# Patient Record
Sex: Male | Born: 1970 | Race: Black or African American | Hispanic: No | Marital: Married | Smoking: Never smoker
Health system: Southern US, Community
[De-identification: ages and names within clinical notes are randomized; demographics above are authoritative.]

## PROBLEM LIST (undated history)

## (undated) DIAGNOSIS — T7840XA Allergy, unspecified, initial encounter: Secondary | ICD-10-CM

## (undated) DIAGNOSIS — I499 Cardiac arrhythmia, unspecified: Secondary | ICD-10-CM

## (undated) DIAGNOSIS — I82409 Acute embolism and thrombosis of unspecified deep veins of unspecified lower extremity: Secondary | ICD-10-CM

## (undated) DIAGNOSIS — J45909 Unspecified asthma, uncomplicated: Secondary | ICD-10-CM

## (undated) HISTORY — PX: ACHILLES TENDON REPAIR: SUR1153

## (undated) HISTORY — DX: Acute embolism and thrombosis of unspecified deep veins of unspecified lower extremity: I82.409

## (undated) HISTORY — DX: Cardiac arrhythmia, unspecified: I49.9

## (undated) HISTORY — DX: Allergy, unspecified, initial encounter: T78.40XA

---

## 2008-05-14 ENCOUNTER — Encounter: Payer: Self-pay | Admitting: Emergency Medicine

## 2008-05-14 ENCOUNTER — Ambulatory Visit (HOSPITAL_COMMUNITY): Admission: RE | Admit: 2008-05-14 | Discharge: 2008-05-14 | Payer: Self-pay | Admitting: Emergency Medicine

## 2008-05-15 ENCOUNTER — Inpatient Hospital Stay (HOSPITAL_COMMUNITY): Admission: EM | Admit: 2008-05-15 | Discharge: 2008-05-17 | Payer: Self-pay | Admitting: Emergency Medicine

## 2008-05-20 ENCOUNTER — Ambulatory Visit: Payer: Self-pay | Admitting: Internal Medicine

## 2008-05-20 ENCOUNTER — Encounter (INDEPENDENT_AMBULATORY_CARE_PROVIDER_SITE_OTHER): Payer: Self-pay | Admitting: Family Medicine

## 2008-05-20 LAB — CONVERTED CEMR LAB
Band Neutrophils: 0 % (ref 0–10)
Eosinophils Absolute: 0.2 10*3/uL (ref 0.0–0.7)
Lymphocytes Relative: 43 % (ref 12–46)
Lymphs Abs: 1.8 10*3/uL (ref 0.7–4.0)
MCHC: 34.4 g/dL (ref 30.0–36.0)
Neutro Abs: 1.8 10*3/uL (ref 1.7–7.7)
Neutrophils Relative %: 43 % (ref 43–77)
RBC: 5.14 M/uL (ref 4.22–5.81)
WBC: 4.3 10*3/uL (ref 4.0–10.5)

## 2008-05-27 ENCOUNTER — Ambulatory Visit: Payer: Self-pay | Admitting: Family Medicine

## 2008-05-27 ENCOUNTER — Ambulatory Visit: Payer: Self-pay | Admitting: Internal Medicine

## 2008-06-10 ENCOUNTER — Ambulatory Visit: Payer: Self-pay | Admitting: Family Medicine

## 2008-06-10 ENCOUNTER — Ambulatory Visit: Payer: Self-pay | Admitting: Internal Medicine

## 2008-07-01 ENCOUNTER — Ambulatory Visit: Payer: Self-pay | Admitting: Family Medicine

## 2008-07-10 ENCOUNTER — Ambulatory Visit: Payer: Self-pay | Admitting: Internal Medicine

## 2008-07-25 ENCOUNTER — Ambulatory Visit (HOSPITAL_COMMUNITY): Admission: RE | Admit: 2008-07-25 | Discharge: 2008-07-25 | Payer: Self-pay | Admitting: Internal Medicine

## 2008-07-25 ENCOUNTER — Encounter: Payer: Self-pay | Admitting: Internal Medicine

## 2008-08-08 ENCOUNTER — Ambulatory Visit: Payer: Self-pay | Admitting: Adult Health

## 2008-08-13 ENCOUNTER — Ambulatory Visit: Payer: Self-pay | Admitting: Internal Medicine

## 2008-08-15 ENCOUNTER — Ambulatory Visit: Payer: Self-pay | Admitting: Internal Medicine

## 2008-09-05 ENCOUNTER — Ambulatory Visit: Payer: Self-pay | Admitting: Adult Health

## 2008-09-19 ENCOUNTER — Ambulatory Visit: Payer: Self-pay | Admitting: Family Medicine

## 2008-10-16 ENCOUNTER — Ambulatory Visit: Payer: Self-pay | Admitting: Family Medicine

## 2008-10-29 ENCOUNTER — Ambulatory Visit: Payer: Self-pay | Admitting: Internal Medicine

## 2008-10-29 ENCOUNTER — Encounter (INDEPENDENT_AMBULATORY_CARE_PROVIDER_SITE_OTHER): Payer: Self-pay | Admitting: Adult Health

## 2008-10-29 LAB — CONVERTED CEMR LAB
ALT: 32 units/L (ref 0–53)
AST: 31 units/L (ref 0–37)
Calcium: 9.5 mg/dL (ref 8.4–10.5)
Chloride: 101 meq/L (ref 96–112)
Creatinine, Ser: 1.24 mg/dL (ref 0.40–1.50)
Total CHOL/HDL Ratio: 4.9
VLDL: 29 mg/dL (ref 0–40)

## 2008-10-30 ENCOUNTER — Ambulatory Visit: Payer: Self-pay | Admitting: Adult Health

## 2010-07-07 LAB — POCT CARDIAC MARKERS: Myoglobin, poc: 90 ng/mL (ref 12–200)

## 2010-07-07 LAB — DIFFERENTIAL
Lymphocytes Relative: 38 % (ref 12–46)
Lymphs Abs: 1.8 10*3/uL (ref 0.7–4.0)
Monocytes Relative: 9 % (ref 3–12)
Neutro Abs: 2.3 10*3/uL (ref 1.7–7.7)
Neutrophils Relative %: 49 % (ref 43–77)

## 2010-07-07 LAB — CBC
HCT: 41.3 % (ref 39.0–52.0)
HCT: 41.5 % (ref 39.0–52.0)
MCHC: 34.3 g/dL (ref 30.0–36.0)
MCV: 90.1 fL (ref 78.0–100.0)
MCV: 90.3 fL (ref 78.0–100.0)
MCV: 92.3 fL (ref 78.0–100.0)
Platelets: 263 10*3/uL (ref 150–400)
Platelets: 264 10*3/uL (ref 150–400)
RBC: 5.02 MIL/uL (ref 4.22–5.81)
RDW: 13.8 % (ref 11.5–15.5)
RDW: 14 % (ref 11.5–15.5)
WBC: 4.7 10*3/uL (ref 4.0–10.5)

## 2010-07-07 LAB — POCT I-STAT, CHEM 8
Creatinine, Ser: 1.2 mg/dL (ref 0.4–1.5)
Glucose, Bld: 103 mg/dL — ABNORMAL HIGH (ref 70–99)
Hemoglobin: 16.7 g/dL (ref 13.0–17.0)
Potassium: 3.8 mEq/L (ref 3.5–5.1)
TCO2: 29 mmol/L (ref 0–100)

## 2010-07-07 LAB — BASIC METABOLIC PANEL
BUN: 12 mg/dL (ref 6–23)
GFR calc non Af Amer: >60 mL/min (ref >60)
Glucose, Bld: 134 mg/dL — ABNORMAL HIGH (ref 70–99)
Potassium: 4.8 mEq/L (ref 3.5–5.1)

## 2010-07-07 LAB — LIPID PANEL
LDL Cholesterol: 140 mg/dL — ABNORMAL HIGH (ref 0–99)
Triglycerides: 56 mg/dL (ref <150)
VLDL: 11 mg/dL (ref 0–40)

## 2010-07-07 LAB — PROTIME-INR
INR: 1 (ref 0.00–1.49)
INR: 1.1 (ref 0.00–1.49)
Prothrombin Time: 13.3 seconds (ref 11.6–15.2)

## 2010-07-07 LAB — HOMOCYSTEINE: Homocysteine: 10.8 umol/L (ref 4.0–15.4)

## 2010-08-04 NOTE — Op Note (Signed)
NAME:  Larry Huff, Larry Huff                 ACCOUNT NO.:  [id number]   MEDICAL RECORD NO.:  [id number]          PATIENT TYPE:  EMS   LOCATION:  MAJO                         FACILITY:  [facility]   PHYSICIAN:  Mark C. Ophelia Charter, M.D.    DATE OF BIRTH:  [birth year]   DATE OF PROCEDURE:  05/16/2008  DATE OF DISCHARGE:                               OPERATIVE REPORT   PREOPERATIVE DIAGNOSIS:  Right Achilles tendon rupture.   POSTOPERATIVE DIAGNOSIS:  Right Achilles tendon rupture.   PROCEDURE:  Repair of right Achilles tendon rupture.   SURGEON:  Mark C. Ophelia Charter, MD   ANESTHESIA:  General.   TOURNIQUET:  None.   ESTIMATED BLOOD LOSS:  Minimal.   After induction of general anesthesia, the patient placed prone on chest  rolls, careful padding and positioning, shoulder pads, arms at 90,  padding over the ulnar nerve, 10/15 drape was applied to the knee, and  the toes, foot, and calves were prepped with DuraPrep.  The usual  stockinette extremity drape was applied.  Sterile skin marker was used  on the skin, and a 3-cm incision was made over the palpable defect and  the Achilles tendon.  Sheath was split.  There was a combination of old  and more recent ruptures with areas of hemorrhage, typical shredded  Achilles tendon with a gap.  A #2 FiberWire was used with Kessler type  sutures coupled but no weave sutures and some horizontal mattress  sutures for repair.  Care was taken with tying down, not to put undue  tension.  Thomas test was normal after the repair.  Wound was irrigated.  Subcutaneous tissue was reapproximated due to the subacute nature of the  Achilles tendon sheath was shredded and scarred down and could not be  close the separate layer.  A 2-0 Vicryl was placed in the subcutaneous  tissue, 3-0 Vicryl subcuticular closure.  Tincture of Benzoin, Steri-  Strips, 4 x 4s, Webril, and a short-leg splint with foot and plantar  flexion to take tension off the repair.  The patient tolerated the  procedure well and was transferred to the recovery room.  His heparin  drip will be restarted in recovery room.      Mark C. Ophelia Charter, M.D.  Electronically Signed     MCY/MEDQ  D:  05/16/2008  T:  05/16/2008  Job:  [id number]

## 2010-08-04 NOTE — H&P (Signed)
NAME:  Larry Huff, Larry Huff NO.:  [id number]   MEDICAL RECORD NO.:  [id number]          PATIENT TYPE:  INP   LOCATION:  4733                         FACILITY:  [facility]   PHYSICIAN:  Vania Rea, M.D. DATE OF BIRTH:  12-22-1970   DATE OF ADMISSION:  05/14/2008  DATE OF DISCHARGE:                              HISTORY & PHYSICAL   PRIMARY CARE PHYSICIAN:  Unassigned.   CHIEF COMPLAINT:  Chest pain since yesterday.   HISTORY OF PRESENT ILLNESS:  This is a 40 year old African American  gentleman with history of having been diagnosed with a ruptured right  Achilles tendon on February 10 while playing basketball as a part of his  job.  Was seen at [facility] and diagnosis was made, was given  ibuprofen anti-inflammatory medications and had a temporary cast  applied, but since then, there apparently have been some administrative  wranglings on his job as to who is responsible for repairing his  Achilles tendon.  The patient has been having pain and swelling of the  foot, has been walking with crutches, but yesterday started having  sudden onset of right-sided chest pain associated with difficulty  breathing.  Eventually, his mother, who is familiar with this type of  situation since her mother died from a pulmonary embolus associated with  blood clots in her leg after leg injury, insisted he come to the  emergency room.  In the emergency room, the patient was evaluated.  A CT  angiogram of the chest was done.  He is found to have a right-sided  pulmonary embolus.  Hospitalist service was called to assist with  management.  He has been having no dizziness nor syncope and no nausea,  vomiting or diarrhea.  No headaches, no palpitations.   PAST MEDICAL HISTORY:  Childhood asthma, allergies to cats and dogs.   MEDICATIONS:  Ibuprofen, Tylenol and took one dose of aspirin when the  chest pain started yesterday.   ALLERGIES:  Erythromycin.   SOCIAL HISTORY:  Denies  tobacco, alcohol or illicit drug use.  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   FAMILY HISTORY:  There is no family history of heart disease, diabetes  or thrombophilia, but his maternal grandmother died from a pulmonary  embolus after having had a fall and injured her leg which was not  attended to.   REVIEW OF SYSTEMS:  On a 10-point review of systems, other than noted  above, unremarkable.   PHYSICAL EXAMINATION:  GENERAL:  A well-built muscular young African  American gentleman, lying on the stretcher in mild distress.  VITALS:  His temperature is 97.7, pulse 88, respiration 18, blood  pressure 137/81.  He is still saturating at 98% on 2 liters.  He  describes pain as 10/10.  His pupils are round and equal.  Mucous  membranes pink and anicteric.  No cervical lymphadenopathy or  thyromegaly.  No jugular venous  distention or carotid bruits.  CHEST:  Clear to auscultation bilaterally.  CARDIOVASCULAR:  Regular rhythm.  ABDOMEN:  Obese, soft and nontender.  EXTREMITIES:  The right lower extremity is in a temporary cast up to his  mid calf.  Toes are warm.  Unable to feel pulses on the right foot  because of the bandage.  On the left, dorsalis is 2+.  CENTRAL NERVOUS SYSTEM:  Cranial nerves II-XII are grossly intact and he  has no focal lateralizing neurologic signs.   LABORATORY DATA:  CBC is unremarkable.  His hemoglobin is 15.8.  His  white count is 4.7.  His serum chemistry is remarkable only for BUN of  21 and creatinine of 1.2 is glucose is 103.  PTT, PT and INR are all  normal.  His D-dimer was 1.9.  Chest x-ray shows no active disease.  A  CT angiogram of the chest shows acute right-sided pulmonary embolus.   ASSESSMENT:  1. Acute pulmonary embolus.  2. Ruptured Achilles tendon.   PLAN:  Will admit this gentleman for  anticoagulation with heparin.  Will  consult orthopedics about possible repair of tendon.  Other plans as per  orders.      Vania Rea, M.D.  Electronically Signed     LC/MEDQ  D:  05/14/2008  T:  05/15/2008  Job:  [id number]   cc:   [facility]

## 2010-08-04 NOTE — Discharge Summary (Signed)
NAME:  Larry Huff, Larry Huff                 ACCOUNT NO.:  [id number]   MEDICAL RECORD NO.:  [id number]          PATIENT TYPE:  INP   LOCATION:  4733                         FACILITY:  [facility]   PHYSICIAN:  Peggye Pitt, M.D. DATE OF BIRTH:  01/11/1971   DATE OF ADMISSION:  05/14/2008  DATE OF DISCHARGE:  05/17/2008                               DISCHARGE SUMMARY   DISCHARGE DIAGNOSES:  1. Acute pulmonary embolism.  2. Ruptured right Achilles tendon status post repair.   DISCHARGE MEDICATIONS:  1. Coumadin 10 mg daily until seen at [facility] on Monday, at which      time, his Coumadin level will be further addressed.  2. Lovenox 115 mg injected subcutaneously twice daily at least until      Monday and then may need to continue further based on results of      INR testing.  3. Percocet 5/325 mg 1-2 tablets every 4 hours as needed for pain.      This prescription has been provided by Dr. Ophelia Charter' assistant.   DISPOSITION AND FOLLOWUP:  Larry Huff is discharged home in stable  condition.  He is to follow up on Monday at [facility] with Fannie Knee  Drinkard for an INR check.  On Monday, he will complete his 5 days of  mandatory overlap for venous thromboembolism core measures.  When his  INR is checked, he will be instructed as to further dosing of Coumadin  and if any further Lovenox will be needed.  He is also instructed to  call his orthopedist, Dr. Annell Greening in 10-14 days for an appointment  and he has been given a card by Dr. Ophelia Charter' PA.   CONSULTATION THIS HOSPITALIZATION:  Dr. Ophelia Charter with Orthopedics.   IMAGES AND PROCEDURES PERFORMED DURING THIS HOSPITALIZATION:  1. A chest x-ray on May 14, 2008, that showed no active lung      disease.  2. CT angiogram of the chest on May 14, 2008, that was positive      for pulmonary emboli within the right pulmonary arterial tree.   HISTORY AND PHYSICAL EXAMINATION:  For full details, please refer to  history and physical dictated by Dr.  Orvan Falconer on May 14, 2008, but  in brief, Larry Huff is a very pleasant, 40 year old African American man  with no significant past medical history who came in with complaints of  chest pain.  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  He was seen at [facility] and a  diagnosis was given, was given ibuprofen and a temporary cast, and then  he was lost to follow up.  He returned today with complaints of chest  pain and shortness of breath and for that reason, his mother brought him  into the emergency room for further evaluation and management.   HOSPITAL COURSE:  1. Pulmonary embolus.  He has been started on Coumadin and Lovenox.      This was likely provoked by his right ruptured  Achilles tendon and      his immobility, hence a hypercoagulable workup has not been      started.  As stated above in disposition section, he will follow up      on Monday for his INR checks and he will be given further      instructions as to his Coumadin and Lovenox doses.  2. Ruptured Achilles tendon.  He has undergone repair by Dr. Annell Greening and he has appropriate follow up with him in 10-14 days.  3. He does not have any other chronic medical issues.   VITAL SIGNS UPON DISCHARGE:  Blood pressure 140/73, heart rate 67,  respirations 20, and O2 saturations 99% on room air with a temperature  of 98.0.   LABORATORIES ON DAY OF DISCHARGE:  WBC 7.9, hemoglobin 14.3, and  platelets of 263.  His INR on day of discharge is 1.2.       Peggye Pitt, M.D.  Electronically Signed     EH/MEDQ  D:  05/17/2008  T:  05/18/2008  Job:  [id number]   cc:   Veverly Fells. Ophelia Charter, M.D.  Willis Modena, MD

## 2012-08-04 ENCOUNTER — Emergency Department (HOSPITAL_COMMUNITY)
Admission: EM | Admit: 2012-08-04 | Discharge: 2012-08-04 | Disposition: A | Discharge status: Home / Self Care | Payer: 59 | Source: Home / Self Care

## 2012-08-04 ENCOUNTER — Emergency Department (INDEPENDENT_AMBULATORY_CARE_PROVIDER_SITE_OTHER): Payer: 59

## 2012-08-04 ENCOUNTER — Encounter (HOSPITAL_COMMUNITY): Payer: Self-pay | Admitting: Emergency Medicine

## 2012-08-04 DIAGNOSIS — K5900 Constipation, unspecified: Secondary | ICD-10-CM

## 2012-08-04 DIAGNOSIS — R141 Gas pain: Secondary | ICD-10-CM

## 2012-08-04 DIAGNOSIS — R143 Flatulence: Secondary | ICD-10-CM

## 2012-08-04 HISTORY — DX: Unspecified asthma, uncomplicated: J45.909

## 2012-08-04 LAB — POCT URINALYSIS DIP (DEVICE)
Hgb urine dipstick: NEGATIVE
Protein, ur: NEGATIVE mg/dL
Specific Gravity, Urine: >=1.03 (ref 1.005–1.030)
Urobilinogen, UA: 0.2 mg/dL (ref 0.0–1.0)
pH: 6 (ref 5.0–8.0)

## 2012-08-04 NOTE — ED Provider Notes (Signed)
Medical screening examination/treatment/procedure(s) were performed by resident physician or non-physician practitioner and as supervising physician I was immediately available for consultation/collaboration.   Barkley Bruns MD.   Linna Hoff, MD 08/04/12 [phone]

## 2012-08-04 NOTE — ED Notes (Signed)
Pt c/o abdominal pain x 5 days. Had diarrhea which has subsided. Feels right flank pain that radiates around to the front. NO n/v or fever. Pt is alert and oriented.

## 2012-08-04 NOTE — ED Provider Notes (Signed)
History     CSN: [id number]  Arrival date & time 08/04/12  1001   None     Chief Complaint  Patient presents with  . Abdominal Pain    (Consider location/radiation/quality/duration/timing/severity/associated sxs/prior treatment) HPI Comments: 42 year old male presents with abdominal discomfort for approximately 5 days. The discomfort began in the right lateral abdomen and then radiated across the mid abdomen. He has had soft stools with one day of diarrhea in the past 4 days. Most of the discomfort occurs at night and lesser during the day. He has been having BMs daily and feels like they are normal for him. He denies flank pain, chest pain or shortness of breath. Denies urinary symptoms.   Past Medical History  Diagnosis Date  . Asthma     Past Surgical History  Procedure Laterality Date  . Achilles tendon repair      No family history on file.  History  Substance Use Topics  . Smoking status: Never Smoker   . Smokeless tobacco: Not on file  . Alcohol Use: 0.0 oz/week    1-2 Glasses of wine per week     Comment: occasionally      Review of Systems  Constitutional: Negative.   HENT: Negative.   Respiratory: Negative.   Cardiovascular: Negative.   Gastrointestinal: Positive for abdominal pain, diarrhea and abdominal distention. Negative for blood in stool and rectal pain.  Genitourinary: Negative.   Skin: Negative.   Neurological: Negative.     Allergies  Erythromycin  Home Medications  No current outpatient prescriptions on file.  BP 111/74  Pulse 58  Temp(Src) 97.8 F (36.6 C) (Oral)  Resp 16  SpO2 100%  Physical Exam  Nursing note and vitals reviewed. Constitutional: He is oriented to person, place, and time. He appears well-developed and well-nourished. No distress.  Patient states he is relatively comfortable today not having much discomfort. Overall abdominal symptoms improving today.  Neck: Normal range of motion.  Cardiovascular: Normal  rate and normal heart sounds.   Pulmonary/Chest: Effort normal.  Abdominal: Soft. Bowel sounds are normal. He exhibits no distension and no mass. There is no rebound and no guarding.  Mild tenderness in the right abdomen between the costal margin and iliac crest.  Musculoskeletal: He exhibits no edema and no tenderness.  Neurological: He is alert and oriented to person, place, and time. He exhibits normal muscle tone.  Skin: Skin is warm and dry.  Psychiatric: He has a normal mood and affect.    ED Course  Procedures (including critical care time)  Labs Reviewed  POCT URINALYSIS DIP (DEVICE)   Dg Abd 1 View  08/04/2012   *RADIOLOGY REPORT*  Clinical Data: Abdominal pain  ABDOMEN - 1 VIEW  Comparison: None.  Findings: Normal bowel gas pattern.  No dilatation or ileus appreciated.  No osseous abnormality.  Left hemi pelvis calcifications compatible with venous phleboliths.  IMPRESSION: No acute finding by plain radiography   Original Report Authenticated By: Judie Petit. Shick, M.D.     1. Abdominal gas pain   2. Constipation       MDM  Patient does not have an acute abdomen. He is feeling better topically after having a bowel movement. There is a gas and stool in the large colon but otherwise no other abnormalities such as air-fluid levels. He will be given instructions for constipation and to take MiraLax as directed. 3 no symptoms problems or worsening, development of fever or increased pain go to emergency apartment.  Hayden Rasmussen, NP 08/04/12 [phone]

## 2013-03-22 DIAGNOSIS — I82409 Acute embolism and thrombosis of unspecified deep veins of unspecified lower extremity: Secondary | ICD-10-CM

## 2013-03-22 HISTORY — PX: ACHILLES TENDON REPAIR: SUR1153

## 2013-03-22 HISTORY — DX: Acute embolism and thrombosis of unspecified deep veins of unspecified lower extremity: I82.409

## 2013-07-05 ENCOUNTER — Encounter (HOSPITAL_COMMUNITY): Payer: Self-pay | Admitting: Emergency Medicine

## 2013-07-05 ENCOUNTER — Observation Stay (HOSPITAL_COMMUNITY)
Admission: EM | Admit: 2013-07-05 | Discharge: 2013-07-06 | Disposition: A | Discharge status: Home / Self Care | Payer: BC Managed Care – PPO | Attending: Internal Medicine | Admitting: Internal Medicine

## 2013-07-05 DIAGNOSIS — Z7901 Long term (current) use of anticoagulants: Secondary | ICD-10-CM | POA: Insufficient documentation

## 2013-07-05 DIAGNOSIS — G459 Transient cerebral ischemic attack, unspecified: Secondary | ICD-10-CM

## 2013-07-05 DIAGNOSIS — Z566 Other physical and mental strain related to work: Secondary | ICD-10-CM

## 2013-07-05 DIAGNOSIS — R200 Anesthesia of skin: Secondary | ICD-10-CM | POA: Diagnosis present

## 2013-07-05 DIAGNOSIS — F430 Acute stress reaction: Secondary | ICD-10-CM | POA: Insufficient documentation

## 2013-07-05 DIAGNOSIS — R51 Headache: Secondary | ICD-10-CM | POA: Insufficient documentation

## 2013-07-05 DIAGNOSIS — R209 Unspecified disturbances of skin sensation: Principal | ICD-10-CM | POA: Insufficient documentation

## 2013-07-05 DIAGNOSIS — J45909 Unspecified asthma, uncomplicated: Secondary | ICD-10-CM | POA: Insufficient documentation

## 2013-07-05 LAB — I-STAT CHEM 8, ED
BUN: 16 mg/dL (ref 6–23)
CREATININE: 1.3 mg/dL (ref 0.50–1.35)
Calcium, Ion: 1.18 mmol/L (ref 1.12–1.23)
Chloride: 100 mEq/L (ref 96–112)
Glucose, Bld: 102 mg/dL — ABNORMAL HIGH (ref 70–99)
HEMATOCRIT: 50 % (ref 39.0–52.0)
HEMOGLOBIN: 17 g/dL (ref 13.0–17.0)
POTASSIUM: 4.8 meq/L (ref 3.7–5.3)
SODIUM: 139 meq/L (ref 137–147)
TCO2: 29 mmol/L (ref 0–100)

## 2013-07-05 LAB — CBG MONITORING, ED: GLUCOSE-CAPILLARY: 93 mg/dL (ref 70–99)

## 2013-07-05 NOTE — ED Provider Notes (Signed)
CSN: [id number]     Arrival date & time 07/05/13  2101 History  This chart was scribed for non-physician practitioner Garald Balding, NP working with Wynetta Fines, MD by Adriana Reams, ED Scribe. This patient was seen in room WA01/WA01 and the patient's care was started at 2315.   First MD Initiated Contact with Patient 07/05/13 2315     Chief Complaint  Patient presents with  . Numbness    The history is provided by the patient. No language interpreter was used.   HPI Comments: Larry Huff is a 43 y.o. male who presents to the Emergency Department complaining of numbness to his left cheek that began at 8 pm tonight and lasted for approximately 60 minutes. He states during this episode the tips of his third and fourth fingers of his left hand also felt numb. He took 2 Asprin tablets at this time which provided some relief. A family member states that his speech was not slurred during this episode and denies facial droop. He denies any trouble walking or balancing, weakness, trouble thinking, trouble speaking, difficulty swallowing, or any other symptoms. He is not currently experiencing numbness.  His father recently had a stroke.  He states during the episode he had a mild HA which has since resolved. He typically gets headaches with his allergies. He has has increased congestion lately, and has been taking Claritin for his symptoms. He also endorses right sided dental pain.   Past Medical History  Diagnosis Date  . Asthma    Past Surgical History  Procedure Laterality Date  . Achilles tendon repair     Family History  Problem Relation Age of Onset  . Stroke Father    History  Substance Use Topics  . Smoking status: Never Smoker   . Smokeless tobacco: Not on file  . Alcohol Use: 0.0 oz/week    1-2 Glasses of wine per week     Comment: occasionally    Review of Systems  Constitutional: Negative for fever.  HENT: Positive for congestion and dental problem. Negative for  rhinorrhea, sinus pressure and trouble swallowing.   Respiratory: Negative for shortness of breath.   Cardiovascular: Negative for chest pain.  Musculoskeletal: Negative for neck pain.  Neurological: Positive for numbness and headaches. Negative for dizziness, facial asymmetry, speech difficulty and weakness.  Psychiatric/Behavioral: Negative for confusion. The patient is nervous/anxious.   All other systems reviewed and are negative.     Allergies  Erythromycin  Home Medications   Prior to Admission medications   Medication Sig Start Date End Date Taking? Authorizing Provider  aspirin 325 MG tablet Take 650 mg by mouth once.   Yes Historical Provider, MD  loratadine (CLARITIN) 10 MG tablet Take 10 mg by mouth daily.   Yes Historical Provider, MD   BP 136/74  Pulse 81  Temp(Src) 98.3 F (36.8 C) (Oral)  Resp 16  SpO2 99%  Physical Exam  Nursing note and vitals reviewed. Constitutional: He appears well-developed and well-nourished.  HENT:  Head: Normocephalic.  Right Ear: External ear normal.  Left Ear: External ear normal.  Eyes: Pupils are equal, round, and reactive to light.  Neck: Normal range of motion.  Cardiovascular: Normal rate and regular rhythm.   Pulmonary/Chest: Effort normal and breath sounds normal.  Abdominal: Soft. Bowel sounds are normal.  Musculoskeletal: Normal range of motion.  Neurological: He is alert. He has normal strength. A sensory deficit is present. No cranial nerve deficit. He displays a negative Romberg  sign.  Skin: Skin is warm and dry.    ED Course  Procedures (including critical care time) DIAGNOSTIC STUDIES: Oxygen Saturation is 99% on RA, normal by my interpretation.    COORDINATION OF CARE: 11:24 PM Discussed treatment plan which includes EKG, CBG monitoring, I-State chem 8 and labs with pt at bedside and pt agreed to plan.    Labs Review Labs Reviewed  CBC WITH DIFFERENTIAL - Abnormal; Notable for the following:     Eosinophils Relative 6 (*)    All other components within normal limits  COMPREHENSIVE METABOLIC PANEL - Abnormal; Notable for the following:    Glucose, Bld 100 (*)    GFR calc non Af Amer 73 (*)    GFR calc Af Amer 85 (*)    All other components within normal limits  I-STAT CHEM 8, ED - Abnormal; Notable for the following:    Glucose, Bld 102 (*)    All other components within normal limits  PROTIME-INR  CBG MONITORING, ED    Imaging Review Ct Head Wo Contrast  07/06/2013   CLINICAL DATA:  Numbness in the left cheek  EXAM: CT HEAD WITHOUT CONTRAST  TECHNIQUE: Contiguous axial images were obtained from the base of the skull through the vertex without intravenous contrast.  COMPARISON:  None.  FINDINGS: There is no evidence of mass effect, midline shift or extra-axial fluid collections. There is no evidence of a space-occupying lesion or intracranial hemorrhage. There is no evidence of a cortical-based area of acute infarction.  The ventricles and sulci are appropriate for the patient's age. The basal cisterns are patent.  Visualized portions of the orbits are unremarkable. The visualized portions of the paranasal sinuses and mastoid air cells are unremarkable.  The osseous structures are unremarkable.  IMPRESSION: Normal CT brain without intravenous contrast.   Electronically Signed   By: Kathreen Devoid   On: 07/06/2013 00:46     EKG Interpretation None      MDM  Spoke with Dr. Nicole [facility]  Would like patient transferred to [facility] will make arrangements for transfer  Final diagnoses:  TIA (transient ischemic attack)      I personally performed the services described in this documentation, which was scribed in my presence. The recorded information has been reviewed and is accurate.    Garald Balding, NP 07/06/13 [phone]

## 2013-07-05 NOTE — ED Notes (Signed)
Pt reports feeling some pressure to the L cheek area and some tingling in his L fingers that started at 8pm. Pt denies any trouble walking or balance, family denies any slurred speech or facial drooping. Family does report giving pt 2 ASA tablets. Pt alert and oriented in triage room.

## 2013-07-06 ENCOUNTER — Emergency Department (HOSPITAL_COMMUNITY): Payer: BC Managed Care – PPO

## 2013-07-06 ENCOUNTER — Observation Stay (HOSPITAL_COMMUNITY): Payer: BC Managed Care – PPO

## 2013-07-06 ENCOUNTER — Encounter (HOSPITAL_COMMUNITY): Payer: Self-pay | Admitting: Internal Medicine

## 2013-07-06 DIAGNOSIS — Z569 Unspecified problems related to employment: Secondary | ICD-10-CM

## 2013-07-06 DIAGNOSIS — G459 Transient cerebral ischemic attack, unspecified: Secondary | ICD-10-CM

## 2013-07-06 DIAGNOSIS — R200 Anesthesia of skin: Secondary | ICD-10-CM | POA: Diagnosis present

## 2013-07-06 DIAGNOSIS — R209 Unspecified disturbances of skin sensation: Principal | ICD-10-CM

## 2013-07-06 LAB — COMPREHENSIVE METABOLIC PANEL
ALBUMIN: 4.5 g/dL (ref 3.5–5.2)
ALK PHOS: 61 U/L (ref 39–117)
ALT: 32 U/L (ref 0–53)
AST: 28 U/L (ref 0–37)
BUN: 12 mg/dL (ref 6–23)
CHLORIDE: 97 meq/L (ref 96–112)
CO2: 27 mEq/L (ref 19–32)
Calcium: 9.7 mg/dL (ref 8.4–10.5)
Creatinine, Ser: 1.21 mg/dL (ref 0.50–1.35)
GFR calc Af Amer: 85 mL/min — ABNORMAL LOW (ref >90)
GFR calc non Af Amer: 73 mL/min — ABNORMAL LOW (ref >90)
GLUCOSE: 100 mg/dL — AB (ref 70–99)
POTASSIUM: 4.3 meq/L (ref 3.7–5.3)
SODIUM: 138 meq/L (ref 137–147)
TOTAL PROTEIN: 8.1 g/dL (ref 6.0–8.3)
Total Bilirubin: 0.5 mg/dL (ref 0.3–1.2)

## 2013-07-06 LAB — LIPID PANEL
CHOLESTEROL: 214 mg/dL — AB (ref 0–200)
HDL: 49 mg/dL (ref >39)
LDL Cholesterol: 151 mg/dL — ABNORMAL HIGH (ref 0–99)
Total CHOL/HDL Ratio: 4.4 RATIO
Triglycerides: 72 mg/dL (ref <150)
VLDL: 14 mg/dL (ref 0–40)

## 2013-07-06 LAB — CBC WITH DIFFERENTIAL/PLATELET
BASOS PCT: 1 % (ref 0–1)
Basophils Absolute: 0 10*3/uL (ref 0.0–0.1)
EOS ABS: 0.3 10*3/uL (ref 0.0–0.7)
Eosinophils Relative: 6 % — ABNORMAL HIGH (ref 0–5)
HCT: 46.7 % (ref 39.0–52.0)
Hemoglobin: 15.7 g/dL (ref 13.0–17.0)
LYMPHS ABS: 2 10*3/uL (ref 0.7–4.0)
Lymphocytes Relative: 34 % (ref 12–46)
MCH: 30.9 pg (ref 26.0–34.0)
MCHC: 33.6 g/dL (ref 30.0–36.0)
MCV: 91.9 fL (ref 78.0–100.0)
Monocytes Absolute: 0.6 10*3/uL (ref 0.1–1.0)
Monocytes Relative: 10 % (ref 3–12)
NEUTROS ABS: 3 10*3/uL (ref 1.7–7.7)
NEUTROS PCT: 50 % (ref 43–77)
PLATELETS: 271 10*3/uL (ref 150–400)
RBC: 5.08 MIL/uL (ref 4.22–5.81)
RDW: 14 % (ref 11.5–15.5)
WBC: 5.9 10*3/uL (ref 4.0–10.5)

## 2013-07-06 LAB — PROTIME-INR
INR: 0.98 (ref 0.00–1.49)
Prothrombin Time: 12.8 seconds (ref 11.6–15.2)

## 2013-07-06 LAB — HEMOGLOBIN A1C
Hgb A1c MFr Bld: 5.8 % — ABNORMAL HIGH (ref <5.7)
MEAN PLASMA GLUCOSE: 120 mg/dL — AB (ref <117)

## 2013-07-06 MED ORDER — LORATADINE 10 MG PO TABS
10.0000 mg | ORAL_TABLET | Freq: Every day | ORAL | Status: DC
  Filled 2013-07-06 (×2): qty 1

## 2013-07-06 MED ORDER — ASPIRIN EC 81 MG PO TBEC: 81.0000 mg | DELAYED_RELEASE_TABLET | Freq: Every day | ORAL | Status: AC

## 2013-07-06 MED ORDER — HEPARIN SODIUM (PORCINE) 5000 UNIT/ML IJ SOLN
5000.0000 [IU] | Freq: Three times a day (TID) | INTRAMUSCULAR | Status: DC
  Administered 2013-07-06: 5000 [IU] via SUBCUTANEOUS
  Filled 2013-07-06 (×4): qty 1

## 2013-07-06 NOTE — Progress Notes (Signed)
Stroke Team Progress Note  HISTORY Larry Huff is an 43 y.o. male a history of asthma who presented to the emergency room at [facility] with a complaint of transient weakness involving left cheek as well as fingertips of left hand. Symptoms had resolved by the time he arrived in the emergency room. He was last known well at 8 PM last night. There is no previous history of stroke nor TIA. Patient has not been on antiplatelet therapy a routine basis. CT scan of his head showed no acute intracranial abnormality. NIH stroke score at the time of this evaluation was 0.   LSN: 8 PM on 07/05/2013  tPA Given: No: Deficits resolved  MRankin: 0   SUBJECTIVE The patient's mother was at the bedside. The patient admits to being under a great deal of stress recently with work and family. He also notes that he had a headache associated with his symptoms.  OBJECTIVE Most recent Vital Signs: Filed Vitals:   07/06/13 0400 07/06/13 0400 07/06/13 0430 07/06/13 0500  BP: 138/69  149/70   Pulse: 80  76   Temp:  98.3 F (36.8 C) 99 F (37.2 C)   TempSrc:   Oral   Resp: 18  18   Height:    6' 4"  (1.93 m)  Weight:    250 lb (113.399 kg)  SpO2: 94%  99%    CBG (last 3)   Recent Labs  07/05/13 2132  GLUCAP 93    IV Fluid Intake:     MEDICATIONS  . heparin  5,000 Units Subcutaneous 3 times per day  . loratadine  10 mg Oral Daily   PRN:    Diet:  Cardiac thin liquids Activity:  As tolerated DVT Prophylaxis:  Subcutaneous heparin  CLINICALLY SIGNIFICANT STUDIES Basic Metabolic Panel:  Recent Labs Lab 07/05/13 2240 07/05/13 2300  NA 138 139  K 4.3 4.8  CL 97 100  CO2 27  --   GLUCOSE 100* 102*  BUN 12 16  CREATININE 1.21 1.30  CALCIUM 9.7  --    Liver Function Tests:  Recent Labs Lab 07/05/13 2240  AST 28  ALT 32  ALKPHOS 61  BILITOT 0.5  PROT 8.1  ALBUMIN 4.5   CBC:  Recent Labs Lab 07/05/13 2240 07/05/13 2300  WBC 5.9  --   NEUTROABS 3.0  --   HGB 15.7 17.0  HCT 46.7 50.0   MCV 91.9  --   PLT 271  --    Coagulation:  Recent Labs Lab 07/05/13 2240  LABPROT 12.8  INR 0.98   Cardiac Enzymes: No results found for this basename: CKTOTAL, CKMB, CKMBINDEX, TROPONINI,  in the last 168 hours Urinalysis: No results found for this basename: COLORURINE, APPERANCEUR, LABSPEC, PHURINE, GLUCOSEU, HGBUR, BILIRUBINUR, KETONESUR, PROTEINUR, UROBILINOGEN, NITRITE, LEUKOCYTESUR,  in the last 168 hours Lipid Panel    Component Value Date/Time   CHOL 209* 10/29/2008 2034   TRIG 144 10/29/2008 2034   HDL 43 10/29/2008 2034   CHOLHDL 4.9 Ratio 10/29/2008 2034   VLDL 29 10/29/2008 2034   LDLCALC 137* 10/29/2008 2034   HgbA1C  Lab Results  Component Value Date   HGBA1C  Value: 5.8 (NOTE)   The ADA recommends the following therapeutic goal for glycemic   control related to Hgb A1C measurement:   Goal of Therapy:   < 7.0% Hgb A1C   Reference: American Diabetes Association: Clinical Practice   Recommendations 2008, Diabetes Care,  2008, 31:(Suppl 1). 05/15/2008    Urine  Drug Screen:   No results found for this basename: labopia, cocainscrnur, labbenz, amphetmu, thcu, labbarb    Alcohol Level: No results found for this basename: ETH,  in the last 168 hours  Ct Head Wo Contrast 07/06/2013    Normal CT brain without intravenous contrast.        MRI / MRA of the brain   1. Normal MRI of the brain. 2. Normal variant MRA circle of Willis without evidence for significant proximal stenosis, aneurysm, or branch vessel occlusion.     2D Echocardiogram  canceled  Carotid Doppler  canceled  CXR    EKG sinus rhythm rate 73 beats per minute For complete results please see formal report.   Therapy Recommendations no followup therapy recommended  Physical Exam   Neurologic Examination:  Mental Status:  Alert, oriented, thought content appropriate. Speech fluent without evidence of aphasia. Able to follow commands without difficulty.  Cranial Nerves:  II-Visual fields were  normal.  III/IV/VI-Pupils were equal and reacted. Extraocular movements were full and conjugate.  V/VII-no facial numbness and no facial weakness.  VIII-normal.  X-normal speech and symmetrical palatal movement.  Motor: 5/5 bilaterally with normal tone and bulk  Sensory: Normal throughout.  Deep Tendon Reflexes: 1+ and symmetric.  Plantars: Flexor bilaterally  Cerebellar: Normal finger-to-nose testing.  Carotid auscultation: Normal  ASSESSMENT Mr. Larry Huff is a 43 y.o. male presenting with numbness of the left side of the face and fingers on the left hand. TPA was not initiated as the patient's symptoms quickly resolved. Imaging showed no abnormalities. On aspirin 325 mg orally every day prior to admission. Now on aspirin 81 mg orally every day for secondary stroke prevention. Patient with resultant resolution of deficits.. Doubt TIA symptoms likely related to stress/anxeity  Work and family stress  Cholesterol 209 LDL 137  Asthma history  Mildly elevated glucose   Hospital day # 1  TREATMENT/PLAN  Continue aspirin 81 mg orally every day for secondary stroke prevention.  Probable complicated migraine.   Mikey Bussing PA-C [facility] Pager [phone] 07/06/2013, 8:08 AM  I have personally obtained a history, examined the patient, evaluated imaging results, and formulated the assessment and plan of care. I agree with the above. Antony Contras, MD   To contact Stroke Continuity provider, please refer to [email or web address] After hours, contact General Neurology

## 2013-07-06 NOTE — H&P (Signed)
[facility] History and Physical  ERASMUS BISTLINE GGE:[id number] DOB: 07/28/1970 DOA: 07/05/2013  Referring physician: EDP PCP: No PCP Per Patient   Chief Complaint: Left facial numbness   HPI: Larry Huff is a 43 y.o. male who presents to the ED complaining of numbness to the L cheek that begain at 8 pm this evening and lasted for about 60 mins before complete resolution.  During this episode 3rd and 4th fingers of L hand also felt numb.  He took 2 ASA at home which seemed to provide relief.  Family member states speech not slurred and no facial droop during this episode.  No difficulty walking, no weakness.  Currently no numbness and at baseline.  He admits to concern given his fathers stroke initially presented the same way.  Review of Systems: Systems reviewed.  As above, otherwise negative  Past Medical History  Diagnosis Date  . Asthma    Past Surgical History  Procedure Laterality Date  . Achilles tendon repair     Social History:  reports that he has never smoked. He does not have any smokeless tobacco history on file. He reports that he drinks alcohol. He reports that he does not use illicit drugs.  Allergies  Allergen Reactions  . Erythromycin Hives    Family History  Problem Relation Age of Onset  . Stroke Father      Prior to Admission medications   Medication Sig Start Date End Date Taking? Authorizing Provider  aspirin 325 MG tablet Take 650 mg by mouth once.   Yes Historical Provider, MD  loratadine (CLARITIN) 10 MG tablet Take 10 mg by mouth daily.   Yes Historical Provider, MD   Physical Exam: Filed Vitals:   07/06/13 0136  BP: 136/74  Pulse: 81  Temp:   Resp: 16    BP 136/74  Pulse 81  Temp(Src) 98.3 F (36.8 C) (Oral)  Resp 16  SpO2 99%  General Appearance:    Alert, oriented, no distress, appears stated age  Head:    Normocephalic, atraumatic  Eyes:    PERRL, EOMI, sclera non-icteric        Nose:   Nares without drainage or  epistaxis. Mucosa, turbinates normal  Throat:   Moist mucous membranes. Oropharynx without erythema or exudate.  Neck:   Supple. No carotid bruits.  No thyromegaly.  No lymphadenopathy.   Back:     No CVA tenderness, no spinal tenderness  Lungs:     Clear to auscultation bilaterally, without wheezes, rhonchi or rales  Chest wall:    No tenderness to palpitation  Heart:    Regular rate and rhythm without murmurs, gallops, rubs  Abdomen:     Soft, non-tender, nondistended, normal bowel sounds, no organomegaly  Genitalia:    deferred  Rectal:    deferred  Extremities:   No clubbing, cyanosis or edema.  Pulses:   2+ and symmetric all extremities  Skin:   Skin color, texture, turgor normal, no rashes or lesions  Lymph nodes:   Cervical, supraclavicular, and axillary nodes normal  Neurologic:   CNII-XII intact. Normal strength, sensation and reflexes      throughout    Labs on Admission:  Basic Metabolic Panel:  Recent Labs Lab 07/05/13 2240 07/05/13 2300  NA 138 139  K 4.3 4.8  CL 97 100  CO2 27  --   GLUCOSE 100* 102*  BUN 12 16  CREATININE 1.21 1.30  CALCIUM 9.7  --    Liver Function  Tests:  Recent Labs Lab 07/05/13 2240  AST 28  ALT 32  ALKPHOS 61  BILITOT 0.5  PROT 8.1  ALBUMIN 4.5   No results found for this basename: LIPASE, AMYLASE,  in the last 168 hours No results found for this basename: AMMONIA,  in the last 168 hours CBC:  Recent Labs Lab 07/05/13 2240 07/05/13 2300  WBC 5.9  --   NEUTROABS 3.0  --   HGB 15.7 17.0  HCT 46.7 50.0  MCV 91.9  --   PLT 271  --    Cardiac Enzymes: No results found for this basename: CKTOTAL, CKMB, CKMBINDEX, TROPONINI,  in the last 168 hours  BNP (last 3 results) No results found for this basename: PROBNP,  in the last 8760 hours CBG:  Recent Labs Lab 07/05/13 2132  GLUCAP 93    Radiological Exams on Admission: Ct Head Wo Contrast  07/06/2013   CLINICAL DATA:  Numbness in the left cheek  EXAM: CT HEAD  WITHOUT CONTRAST  TECHNIQUE: Contiguous axial images were obtained from the base of the skull through the vertex without intravenous contrast.  COMPARISON:  None.  FINDINGS: There is no evidence of mass effect, midline shift or extra-axial fluid collections. There is no evidence of a space-occupying lesion or intracranial hemorrhage. There is no evidence of a cortical-based area of acute infarction.  The ventricles and sulci are appropriate for the patient's age. The basal cisterns are patent.  Visualized portions of the orbits are unremarkable. The visualized portions of the paranasal sinuses and mastoid air cells are unremarkable.  The osseous structures are unremarkable.  IMPRESSION: Normal CT brain without intravenous contrast.   Electronically Signed   By: Kathreen Devoid   On: 07/06/2013 00:46    EKG: Independently reviewed.  Assessment/Plan Principal Problem:   Left facial numbness   Left facial numbness - The patient does seem to be at somewhat lower risk for stroke, specifically he has absolutely no risk factors and would be unusually young, all of this contrasts starkly with his father at bedside who had long standing high cholesterol, HTN, DM2, at the time of his stroke at age 80.  Never the less, will keep patient on tele monitor for his observation stay and we will rule out TIA with MRI brain, if positive then rest of stroke work up should be ordered (I havent ordered this for now given that he seems somewhat lower risk at this time), additionally Dr. Nicole Kindred has been consulted and patient being transferred to [facility] at Neurology request.    Code Status: Full  Family Communication: Family at bedside Disposition Plan: Admit to obs   Time spent: 50 min  [facility] Pager [phone]  If 7AM-7PM, please contact the day team taking care of the patient [email or web address] Password [email or web address] 07/06/2013, 3:01 AM

## 2013-07-06 NOTE — ED Notes (Signed)
Patient and patient's family informed of POC to include transfer to [facility] Patient alert and oriented x 4 PERRLA Neuro check and stroke scale completed Patient denies complaints at this time Patient with cycling VS Awaiting bed assignment at [facility]

## 2013-07-06 NOTE — Consult Note (Signed)
Referring Physician: Dr. Alcario Drought    Chief Complaint: Transient numbness involving left cheek and hand.  HPI: Larry Huff is an 43 y.o. male a history of asthma who presented to the emergency room at [facility] with a complaint of transient weakness involving left cheek as well as fingertips of left hand. Symptoms had resolved by the time he arrived in the emergency room. He was last known well at 8 PM last night. There is no previous history of stroke nor TIA. Patient has not been on antiplatelet therapy a routine basis. CT scan of his head showed no acute intracranial abnormality. NIH stroke score at the time of this evaluation was 0.  LSN:  8 PM on 07/05/2013 tPA Given: No: Deficits resolved MRankin: 0  Past Medical History  Diagnosis Date  . Asthma     Family History  Problem Relation Age of Onset  . Stroke Father      Medications: I have reviewed the patient's current medications.  ROS: History obtained from the patient  General ROS: negative for - chills, fatigue, fever, night sweats, weight gain or weight loss Psychological ROS: negative for - behavioral disorder, hallucinations, memory difficulties, mood swings or suicidal ideation Ophthalmic ROS: negative for - blurry vision, double vision, eye pain or loss of vision ENT ROS: negative for - epistaxis, nasal discharge, oral lesions, sore throat, tinnitus or vertigo Allergy and Immunology ROS: negative for - hives or itchy/watery eyes Hematological and Lymphatic ROS: negative for - bleeding problems, bruising or swollen lymph nodes Endocrine ROS: negative for - galactorrhea, hair pattern changes, polydipsia/polyuria or temperature intolerance Respiratory ROS: negative for - cough, hemoptysis, shortness of breath or wheezing Cardiovascular ROS: negative for - chest pain, dyspnea on exertion, edema or irregular heartbeat Gastrointestinal ROS: negative for - abdominal pain, diarrhea, hematemesis, nausea/vomiting or stool  incontinence Genito-Urinary ROS: negative for - dysuria, hematuria, incontinence or urinary frequency/urgency Musculoskeletal ROS: negative for - joint swelling or muscular weakness Neurological ROS: as noted in HPI Dermatological ROS: negative for rash and skin lesion changes  Physical Examination: Blood pressure 138/69, pulse 80, temperature 98.3 F (36.8 C), temperature source Oral, resp. rate 18, height 6' 4"  (1.93 m), weight 113.399 kg (250 lb), SpO2 94.00%.  Neurologic Examination: Mental Status: Alert, oriented, thought content appropriate.  Speech fluent without evidence of aphasia. Able to follow commands without difficulty. Cranial Nerves: II-Visual fields were normal. III/IV/VI-Pupils were equal and reacted. Extraocular movements were full and conjugate.    V/VII-no facial numbness and no facial weakness. VIII-normal. X-normal speech and symmetrical palatal movement. Motor: 5/5 bilaterally with normal tone and bulk Sensory: Normal throughout. Deep Tendon Reflexes: 1+ and symmetric. Plantars: Flexor bilaterally Cerebellar: Normal finger-to-nose testing. Carotid auscultation: Normal  Ct Head Wo Contrast  07/06/2013   CLINICAL DATA:  Numbness in the left cheek  EXAM: CT HEAD WITHOUT CONTRAST  TECHNIQUE: Contiguous axial images were obtained from the base of the skull through the vertex without intravenous contrast.  COMPARISON:  None.  FINDINGS: There is no evidence of mass effect, midline shift or extra-axial fluid collections. There is no evidence of a space-occupying lesion or intracranial hemorrhage. There is no evidence of a cortical-based area of acute infarction.  The ventricles and sulci are appropriate for the patient's age. The basal cisterns are patent.  Visualized portions of the orbits are unremarkable. The visualized portions of the paranasal sinuses and mastoid air cells are unremarkable.  The osseous structures are unremarkable.  IMPRESSION: Normal CT brain without  intravenous  contrast.   Electronically Signed   By: Kathreen Devoid   On: 07/06/2013 00:46    Assessment: 43 y.o. male presenting with possible right subcortical transient ischemic attack. Small ischemic small vessel stroke cannot be ruled out, as well.  Stroke Risk Factors - family history  Plan: 1. HgbA1c, fasting lipid panel 2. MRI, MRA  of the brain without contrast 3. Echocardiogram 4. Carotid dopplers 5. Prophylactic therapy-Antiplatelet med: Aspirin  6. Risk factor modification 7. Telemetry monitoring and 8. Studies to rule out hypercoagulable state   C.R. Nicole Kindred, MD [facility] [phone]  07/06/2013, 6:15 AM

## 2013-07-06 NOTE — Discharge Summary (Signed)
Physician Discharge Summary  Larry Huff LJQ:[id number] DOB: 08-Nov-1970 DOA: 07/05/2013  PCP: No PCP Per Patient  Admit date: 07/05/2013 Discharge date: 07/06/2013  Time spent: 35 minutes  Recommendations for Outpatient Follow-up:  1. Follow up with PCP in 1-2 weeks  Discharge Diagnoses:  Principal Problem:   Left facial numbness Stress  Discharge Condition: Stable  Diet recommendation: Regular  Filed Weights   07/06/13 0500  Weight: 113.399 kg (250 lb)    History of present illness:  Larry Huff is a 43 y.o. male who presents to the ED complaining of numbness to the L cheek that begain at 8 pm this evening and lasted for about 60 mins before complete resolution. During this episode 3rd and 4th fingers of L hand also felt numb. He took 2 ASA at home which seemed to provide relief. Family member states speech not slurred and no facial droop during this episode. No difficulty walking, no weakness. Currently no numbness and at baseline.  Hospital Course:  L facial numbness  1. Stroke work up was unremarkable 2. CT head unremarkable 3. MRI/MRA head unremarkable 4. Stroke team consulted. I discussed the case with Neurology who feels the patient's presenting symptoms are secondary to high stress at work. No further neuro work up is needed. The patient has since remained medically stable. DVT prophylaxis  1. Heparin subQ while inpatient  Procedures:    Consultations:  Neurology  Discharge Exam: Filed Vitals:   07/06/13 0400 07/06/13 0430 07/06/13 0500 07/06/13 0950  BP:  149/70  138/80  Pulse:  76  81  Temp: 98.3 F (36.8 C) 99 F (37.2 C)  98.1 F (36.7 C)  TempSrc:  Oral  Oral  Resp:  18  20  Height:   6' 4"  (1.93 m)   Weight:   113.399 kg (250 lb)   SpO2:  99%  100%    General: Awake, in nad Cardiovascular: regular, s1, s2 Respiratory: normal resp effort  Discharge Instructions     Medication List    STOP taking these medications       aspirin 325  MG tablet  Replaced by:  aspirin EC 81 MG tablet      TAKE these medications       aspirin EC 81 MG tablet  Take 1 tablet (81 mg total) by mouth daily.     loratadine 10 MG tablet  Commonly known as:  CLARITIN  Take 10 mg by mouth daily.       Allergies  Allergen Reactions  . Erythromycin Hives   Follow-up Information   Follow up with follow up with PCP in 1-2 weeks. Schedule an appointment as soon as possible for a visit in 1 week.       The results of significant diagnostics from this hospitalization (including imaging, microbiology, ancillary and laboratory) are listed below for reference.    Significant Diagnostic Studies: Ct Head Wo Contrast  07/06/2013   CLINICAL DATA:  Numbness in the left cheek  EXAM: CT HEAD WITHOUT CONTRAST  TECHNIQUE: Contiguous axial images were obtained from the base of the skull through the vertex without intravenous contrast.  COMPARISON:  None.  FINDINGS: There is no evidence of mass effect, midline shift or extra-axial fluid collections. There is no evidence of a space-occupying lesion or intracranial hemorrhage. There is no evidence of a cortical-based area of acute infarction.  The ventricles and sulci are appropriate for the patient's age. The basal cisterns are patent.  Visualized portions of the  orbits are unremarkable. The visualized portions of the paranasal sinuses and mastoid air cells are unremarkable.  The osseous structures are unremarkable.  IMPRESSION: Normal CT brain without intravenous contrast.   Electronically Signed   By: Kathreen Devoid   On: 07/06/2013 00:46   Mr Brain Wo Contrast  07/06/2013   CLINICAL DATA:  Left facial numbness.  EXAM: MRI HEAD WITHOUT CONTRAST  MRA HEAD WITHOUT CONTRAST  TECHNIQUE: Multiplanar, multiecho pulse sequences of the brain and surrounding structures were obtained without intravenous contrast. Angiographic images of the head were obtained using MRA technique without contrast.  COMPARISON:  CT head from  the same day.  FINDINGS: MRI HEAD FINDINGS  No acute infarct, hemorrhage, or mass lesion is present. The ventricles are of normal size. No significant extraaxial fluid collection is present. No significant white matter disease is present. Flow is present in the major intracranial arteries. The globes and orbits are intact. The paranasal sinuses and mastoid air cells are clear.  MRA HEAD FINDINGS  The internal carotid arteries are within normal limits from the high cervical segments through the ICA termini bilaterally. The A1 and M1 segments are normal. MCA bifurcations are within normal limits. ACA and MCA branch vessels are normal.  The left vertebral artery is the dominant vessel. The PICA origin is visualized and normal. The right AICA is dominant. The basilar artery is normal. Both posterior cerebral arteries originate from the basilar tip. The PCA branch vessels are within normal limits.  IMPRESSION: 1. Normal MRI of the brain. 2. Normal variant MRA circle of Willis without evidence for significant proximal stenosis, aneurysm, or branch vessel occlusion.   Electronically Signed   By: Lawrence Santiago M.D.   On: 07/06/2013 08:52   Mr Jodene Nam Head/brain Wo Cm  07/06/2013   CLINICAL DATA:  Left facial numbness.  EXAM: MRI HEAD WITHOUT CONTRAST  MRA HEAD WITHOUT CONTRAST  TECHNIQUE: Multiplanar, multiecho pulse sequences of the brain and surrounding structures were obtained without intravenous contrast. Angiographic images of the head were obtained using MRA technique without contrast.  COMPARISON:  CT head from the same day.  FINDINGS: MRI HEAD FINDINGS  No acute infarct, hemorrhage, or mass lesion is present. The ventricles are of normal size. No significant extraaxial fluid collection is present. No significant white matter disease is present. Flow is present in the major intracranial arteries. The globes and orbits are intact. The paranasal sinuses and mastoid air cells are clear.  MRA HEAD FINDINGS  The internal  carotid arteries are within normal limits from the high cervical segments through the ICA termini bilaterally. The A1 and M1 segments are normal. MCA bifurcations are within normal limits. ACA and MCA branch vessels are normal.  The left vertebral artery is the dominant vessel. The PICA origin is visualized and normal. The right AICA is dominant. The basilar artery is normal. Both posterior cerebral arteries originate from the basilar tip. The PCA branch vessels are within normal limits.  IMPRESSION: 1. Normal MRI of the brain. 2. Normal variant MRA circle of Willis without evidence for significant proximal stenosis, aneurysm, or branch vessel occlusion.   Electronically Signed   By: Lawrence Santiago M.D.   On: 07/06/2013 08:52    Microbiology: No results found for this or any previous visit (from the past 240 hour(s)).   Labs: Basic Metabolic Panel:  Recent Labs Lab 07/05/13 2240 07/05/13 2300  NA 138 139  K 4.3 4.8  CL 97 100  CO2 27  --  GLUCOSE 100* 102*  BUN 12 16  CREATININE 1.21 1.30  CALCIUM 9.7  --    Liver Function Tests:  Recent Labs Lab 07/05/13 2240  AST 28  ALT 32  ALKPHOS 61  BILITOT 0.5  PROT 8.1  ALBUMIN 4.5   No results found for this basename: LIPASE, AMYLASE,  in the last 168 hours No results found for this basename: AMMONIA,  in the last 168 hours CBC:  Recent Labs Lab 07/05/13 2240 07/05/13 2300  WBC 5.9  --   NEUTROABS 3.0  --   HGB 15.7 17.0  HCT 46.7 50.0  MCV 91.9  --   PLT 271  --    Cardiac Enzymes: No results found for this basename: CKTOTAL, CKMB, CKMBINDEX, TROPONINI,  in the last 168 hours BNP: BNP (last 3 results) No results found for this basename: PROBNP,  in the last 8760 hours CBG:  Recent Labs Lab 07/05/13 2132  GLUCAP 93    Signed:  Donne Hazel  [facility] 07/06/2013, 11:58 AM

## 2013-07-06 NOTE — ED Notes (Signed)
[facility] present to transport patient to [facility]

## 2013-07-06 NOTE — ED Provider Notes (Signed)
Medical screening examination/treatment/procedure(s) were performed by non-physician practitioner and as supervising physician I was immediately available for consultation/collaboration.   EKG Interpretation None        Wynetta Fines, MD 07/06/13 [phone]

## 2013-07-06 NOTE — Progress Notes (Signed)
NURSING PROGRESS NOTE  Larry Huff [id number] Discharge Data: 07/06/2013 4:31 PM Attending Provider: No att. providers found PCP:No PCP Per Patient     [name] to be D/C'd Home per MD order.  Discussed with the patient the After Visit Summary and all questions fully answered. All IV's discontinued with no bleeding noted. All belongings returned to patient for patient to take home. Stroke education was provided and patient voiced having an understanding.  Last Vital Signs:  Blood pressure 138/81, pulse 68, temperature 98.1 F (36.7 C), temperature source Oral, resp. rate 20, height 6' 4"  (1.93 m), weight 113.399 kg (250 lb), SpO2 99.00%.  Discharge Medication List   Medication List    STOP taking these medications       aspirin 325 MG tablet  Replaced by:  aspirin EC 81 MG tablet      TAKE these medications       aspirin EC 81 MG tablet  Take 1 tablet (81 mg total) by mouth daily.     loratadine 10 MG tablet  Commonly known as:  CLARITIN  Take 10 mg by mouth daily.

## 2013-07-06 NOTE — ED Notes (Signed)
Report given to Tyrell Antonio, Therapist, sports at [facility]

## 2015-03-14 ENCOUNTER — Ambulatory Visit (INDEPENDENT_AMBULATORY_CARE_PROVIDER_SITE_OTHER): Payer: Commercial Managed Care - HMO | Admitting: Physician Assistant

## 2015-03-14 ENCOUNTER — Encounter: Payer: Self-pay | Admitting: Physician Assistant

## 2015-03-14 VITALS — BP 148/89 | HR 80 | Temp 98.0°F | Resp 16 | Ht 75.0 in | Wt 247.0 lb

## 2015-03-14 DIAGNOSIS — E669 Obesity, unspecified: Secondary | ICD-10-CM

## 2015-03-14 DIAGNOSIS — E66811 Obesity, class 1: Secondary | ICD-10-CM | POA: Insufficient documentation

## 2015-03-14 DIAGNOSIS — E739 Lactose intolerance, unspecified: Secondary | ICD-10-CM

## 2015-03-14 DIAGNOSIS — Z13228 Encounter for screening for other metabolic disorders: Secondary | ICD-10-CM

## 2015-03-14 DIAGNOSIS — Z125 Encounter for screening for malignant neoplasm of prostate: Secondary | ICD-10-CM | POA: Diagnosis not present

## 2015-03-14 DIAGNOSIS — Z0000 Encounter for general adult medical examination without abnormal findings: Secondary | ICD-10-CM | POA: Diagnosis not present

## 2015-03-14 DIAGNOSIS — Z130 Encounter for screening for diseases of the blood and blood-forming organs and certain disorders involving the immune mechanism: Secondary | ICD-10-CM

## 2015-03-14 DIAGNOSIS — Z1329 Encounter for screening for other suspected endocrine disorder: Secondary | ICD-10-CM

## 2015-03-14 DIAGNOSIS — Z13220 Encounter for screening for lipoid disorders: Secondary | ICD-10-CM | POA: Diagnosis not present

## 2015-03-14 LAB — CBC WITH DIFFERENTIAL/PLATELET
BASOS ABS: 0 10*3/uL (ref 0.0–0.1)
Basophils Relative: 0 % (ref 0–1)
Eosinophils Absolute: 0.1 10*3/uL (ref 0.0–0.7)
Eosinophils Relative: 3 % (ref 0–5)
HEMATOCRIT: 45.9 % (ref 39.0–52.0)
HEMOGLOBIN: 15.6 g/dL (ref 13.0–17.0)
Lymphocytes Relative: 45 % (ref 12–46)
Lymphs Abs: 1.7 10*3/uL (ref 0.7–4.0)
MCH: 30.8 pg (ref 26.0–34.0)
MCHC: 34 g/dL (ref 30.0–36.0)
MCV: 90.5 fL (ref 78.0–100.0)
MPV: 9.7 fL (ref 8.6–12.4)
Monocytes Absolute: 0.3 10*3/uL (ref 0.1–1.0)
Monocytes Relative: 8 % (ref 3–12)
NEUTROS ABS: 1.7 10*3/uL (ref 1.7–7.7)
Neutrophils Relative %: 44 % (ref 43–77)
Platelets: 243 10*3/uL (ref 150–400)
RBC: 5.07 MIL/uL (ref 4.22–5.81)
RDW: 14.3 % (ref 11.5–15.5)
WBC: 3.8 10*3/uL — AB (ref 4.0–10.5)

## 2015-03-14 LAB — LIPID PANEL
Cholesterol: 203 mg/dL — ABNORMAL HIGH (ref 125–200)
HDL: 48 mg/dL (ref >=40)
LDL CALC: 137 mg/dL — AB (ref <130)
TRIGLYCERIDES: 91 mg/dL (ref <150)
Total CHOL/HDL Ratio: 4.2 Ratio (ref <=5.0)
VLDL: 18 mg/dL (ref <30)

## 2015-03-14 LAB — COMPLETE METABOLIC PANEL WITH GFR
ALT: 24 U/L (ref 9–46)
AST: 26 U/L (ref 10–40)
Albumin: 4.7 g/dL (ref 3.6–5.1)
Alkaline Phosphatase: 58 U/L (ref 40–115)
BILIRUBIN TOTAL: 0.7 mg/dL (ref 0.2–1.2)
BUN: 14 mg/dL (ref 7–25)
CHLORIDE: 103 mmol/L (ref 98–110)
CO2: 27 mmol/L (ref 20–31)
Calcium: 9.7 mg/dL (ref 8.6–10.3)
Creat: 1.2 mg/dL (ref 0.60–1.35)
GFR, EST AFRICAN AMERICAN: 86 mL/min (ref >=60)
GFR, Est Non African American: 74 mL/min (ref >=60)
GLUCOSE: 98 mg/dL (ref 65–99)
Potassium: 4.3 mmol/L (ref 3.5–5.3)
SODIUM: 139 mmol/L (ref 135–146)
TOTAL PROTEIN: 7.5 g/dL (ref 6.1–8.1)

## 2015-03-14 LAB — TSH: TSH: 3.384 u[IU]/mL (ref 0.350–4.500)

## 2015-03-14 NOTE — Patient Instructions (Signed)

## 2015-03-14 NOTE — Progress Notes (Signed)
Larry Huff  MRN: [id number] DOB: 1970/11/06  Subjective:  Pt presents to clinic for a CPE.  He is having no problems.    Last dental exam: every 6 months Last vision exam: not recently - he needs it Vaccinations      Tetanus - 2014     Patient Active Problem List   Diagnosis Date Noted  . Lactose intolerance 03/14/2015    Current Outpatient Prescriptions on File Prior to Visit  Medication Sig Dispense Refill  . aspirin EC 81 MG tablet Take 1 tablet (81 mg total) by mouth daily.     No current facility-administered medications on file prior to visit.    Allergies  Allergen Reactions  . Eggs Or Egg-Derived Products Nausea Only  . Erythromycin Hives  . Milk-Related Compounds Diarrhea    Social History   Social History  . Marital Status: Married    Spouse Name: N/A  . Number of Children: N/A  . Years of Education: N/A   Occupational History  . Mental Health    Social History Main Topics  . Smoking status: Never Smoker   . Smokeless tobacco: None  . Alcohol Use: No     Comment: occasionally  . Drug Use: No  . Sexual Activity:    Partners: Female   Other Topics Concern  . 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 Achilles tendon repair      No family history on file.  Review of Systems  Constitutional: Negative.   HENT: Negative.   Eyes: Negative.   Respiratory: Negative.   Cardiovascular: Negative.   Gastrointestinal: Negative.   Endocrine: Negative.   Genitourinary: Negative.   Musculoskeletal: Negative.   Skin: Negative.   Allergic/Immunologic: Negative.   Neurological: Negative.   Hematological: Negative.   Psychiatric/Behavioral: Negative.    Objective:  BP 148/89 mmHg  Pulse 80   Temp(Src) 98 F (36.7 C) (Oral)  Resp 16  Ht 6' 3"  (1.905 m)  Wt 247 lb (112.038 kg)  BMI 30.87 kg/m2  SpO2 98%  Physical Exam  Constitutional: He is oriented to person, place, and time and well-developed, well-nourished, and in no distress.  HENT:  Head: Normocephalic and atraumatic.  Right Ear: Hearing, tympanic membrane, external ear and ear canal normal.  Left Ear: Hearing, tympanic membrane, external ear and ear canal normal.  Nose: Nose normal.  Mouth/Throat: Uvula is midline, oropharynx is clear and moist and mucous membranes are normal.  Eyes: Conjunctivae and EOM are normal. Pupils are equal, round, and reactive to light.  Neck: Trachea normal and normal range of motion. Neck supple. No thyroid mass and no thyromegaly present.  Cardiovascular: Normal rate, regular rhythm and normal heart sounds.   No murmur heard. Pulmonary/Chest: Effort normal and breath sounds normal.  Abdominal: Soft. Bowel sounds are normal.  Genitourinary: Rectum normal, prostate normal, testes/scrotum normal and penis normal.  Musculoskeletal: Normal range of motion.  Neurological: He is alert and oriented to person, place, and time. He has normal reflexes. Gait normal.  Skin: Skin is warm and dry.  Psychiatric: Mood, memory, affect and judgment normal.    Visual Acuity Screening  Right eye Left eye Both eyes  Without correction: 20/200 20/50 20/50  With correction:       Assessment and Plan :  Annual physical exam - anticipatory guidance  Screening for deficiency anemia - Plan: CBC with Differential/Platelet  Screening for metabolic disorder - Plan: COMPLETE METABOLIC PANEL WITH GFR  Screening cholesterol level - Plan: Lipid panel  Screening for thyroid disorder - Plan: TSH  Screening prostate cancer - Plan: PSA - d/w pt the risks and benefits of screening and the patient would like to proceed  Obesity - pt is eating on the run most of the time because he is so busy - he does exercise -  he will work on improved diet  Vision abnl - needs to have his vision tested - pt to make an appointment  Windell Hummingbird PA-C  [facility] 03/14/2015 3:12 PM

## 2015-03-15 LAB — PSA: PSA: 0.82 ng/mL (ref <=4.00)

## 2015-03-19 ENCOUNTER — Encounter: Payer: Self-pay | Admitting: Physician Assistant

## 2016-03-12 ENCOUNTER — Ambulatory Visit (INDEPENDENT_AMBULATORY_CARE_PROVIDER_SITE_OTHER): Payer: Managed Care, Other (non HMO) | Admitting: Primary Care

## 2016-03-12 ENCOUNTER — Encounter: Payer: Self-pay | Admitting: Primary Care

## 2016-03-12 VITALS — BP 134/82 | HR 63 | Temp 98.3°F | Ht 75.0 in | Wt 250.1 lb

## 2016-03-12 DIAGNOSIS — J45909 Unspecified asthma, uncomplicated: Secondary | ICD-10-CM | POA: Insufficient documentation

## 2016-03-12 DIAGNOSIS — J4520 Mild intermittent asthma, uncomplicated: Secondary | ICD-10-CM

## 2016-03-12 DIAGNOSIS — Z0000 Encounter for general adult medical examination without abnormal findings: Secondary | ICD-10-CM

## 2016-03-12 MED ORDER — ALBUTEROL SULFATE HFA 108 (90 BASE) MCG/ACT IN AERS: 2.0000 | INHALATION_SPRAY | Freq: Four times a day (QID) | RESPIRATORY_TRACT | 1 refills | Status: DC | PRN

## 2016-03-12 NOTE — Assessment & Plan Note (Signed)
Td UTD, declines influenza vaccination. Discussed the importance of a healthy diet and regular exercise in order to reduce the risk of other medical diseases. Exam unremarkable. Labs pending. Follow up in 1 year for annual physical.

## 2016-03-12 NOTE — Progress Notes (Signed)
Subjective:    Patient ID: Larry Huff, male    DOB: [birth year], 45 y.o.   MRN: [id number]  HPI  [name] is a 45 year old male who presents today to establish care and discuss the problems mentioned below. Will obtain old records.  1) Asthma: Diagnosed in childhood. Experiences symptoms of sinus congestion, wheezing, rhinorrhea, chest tightness. He will experience flares during seasonal changes and with animal dander. He does not have an albuterol inhaler.   Immunizations: -Tetanus: Completed in 2014 -Influenza: Declines    Diet: He endorses a fair diet. Breakfast: Fruit, smoothies, occasional fast food Lunch: Veggie sandwich, spinach casserole  Dinner: Veggie pasta, cous-cous, veggies, fish Snacks: Fruit Desserts: Occasionally Beverages: Water, juice  Exercise: He does martial arts occasionally, does not routinely exercise. Eye exam: Completed in 2017, new glasses. Dental exam: Completes annually.   Review of Systems  Constitutional: Negative for unexpected weight change.  HENT: Negative for rhinorrhea.   Respiratory: Negative for cough and shortness of breath.   Cardiovascular: Negative for chest pain.  Gastrointestinal: Negative for constipation and diarrhea.  Genitourinary: Negative for difficulty urinating.  Musculoskeletal: Negative for arthralgias and myalgias.  Skin: Negative for rash.  Allergic/Immunologic: Negative for environmental allergies.  Neurological: Negative for dizziness, numbness and headaches.  Psychiatric/Behavioral:       He denies concerns for anxiety and depression       Past Medical History:  Diagnosis Date  . Allergy   . Asthma      Social History   Social History  . Marital status: Married    Spouse name: Larry Huff  . Number of children: Larry Huff  . Years of education: Larry Huff   Occupational History  . Mental Health    Social History Main Topics  . Smoking status: Never Smoker  . Smokeless tobacco: Not on file  . Alcohol use No   Comment: occasionally  . Drug use: No  . Sexual activity: Yes    Partners: Female   Other Topics Concern  . 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       Past Surgical History:  Procedure Laterality Date  . ACHILLES TENDON REPAIR      No family history on file.  Allergies  Allergen Reactions  . Eggs Or Egg-Derived Products Nausea Only  . Erythromycin Hives  . Milk-Related Compounds Diarrhea    Current Outpatient Prescriptions on File Prior to Visit  Medication Sig Dispense Refill  . aspirin EC 81 MG tablet Take 1 tablet (81 mg total) by mouth daily. (Patient not taking: Reported on 03/12/2016)     No current facility-administered medications on file prior to visit.     BP 134/82   Pulse 63   Temp 98.3 F (36.8 C) (Oral)   Ht 6' 3"  (1.905 m)   Wt 250 lb 1.9 oz (113.5 kg)   SpO2 98%   BMI 31.26 kg/m    Objective:   Physical Exam  Constitutional: He is oriented to person, place, and time. He appears well-nourished.  HENT:  Right Ear: Tympanic membrane and ear canal normal.  Left Ear: Tympanic membrane and ear canal normal.  Nose: Nose normal. Right  sinus exhibits no maxillary sinus tenderness and no frontal sinus tenderness. Left sinus exhibits no maxillary sinus tenderness and no frontal sinus tenderness.  Mouth/Throat: Oropharynx is clear and moist.  Eyes: Conjunctivae and EOM are normal. Pupils are equal, round, and reactive to light.  Neck: Neck supple. Carotid bruit is not present. No thyromegaly present.  Cardiovascular: Normal rate, regular rhythm and normal heart sounds.   Pulmonary/Chest: Effort normal and breath sounds normal. He has no wheezes. He has no rales.  Abdominal: Soft. Bowel sounds are normal.  There is no tenderness.  Musculoskeletal: Normal range of motion.  Neurological: He is alert and oriented to person, place, and time. He has normal reflexes. No cranial nerve deficit.  Skin: Skin is warm and dry.  Psychiatric: He has a normal mood and affect.          Assessment & Plan:

## 2016-03-12 NOTE — Progress Notes (Signed)
Pre visit review using our clinic review tool, if applicable. No additional management support is needed unless otherwise documented below in the visit note. 

## 2016-03-12 NOTE — Assessment & Plan Note (Signed)
Diagnosed in childhood, experiences mild flares during seasonal changes and with animal dander.  Rx for albuterol inhaler sent to pharmacy for PRN use.  Will continue to monitor.

## 2016-03-12 NOTE — Patient Instructions (Signed)
Continue your efforts towards a healthy diet.  Continue exercising. You should be getting 150 minutes of moderate intensity exercise weekly.  Ensure you are consuming 64 ounces of water daily.  Schedule a lab only appointment up front to return for your fasting labs. Ensure you are fasting for 4 hours. You may have water and black coffee only.  Follow up in 1 year for your annual exam or sooner if needed.  It was a pleasure to meet you today! Please don't hesitate to call me with any questions. Welcome to [facility]!

## 2016-03-17 ENCOUNTER — Other Ambulatory Visit (INDEPENDENT_AMBULATORY_CARE_PROVIDER_SITE_OTHER): Payer: Managed Care, Other (non HMO)

## 2016-03-17 DIAGNOSIS — Z0000 Encounter for general adult medical examination without abnormal findings: Secondary | ICD-10-CM

## 2016-03-17 LAB — COMPREHENSIVE METABOLIC PANEL
ALBUMIN: 4.5 g/dL (ref 3.5–5.2)
ALK PHOS: 60 U/L (ref 39–117)
ALT: 16 U/L (ref 0–53)
AST: 23 U/L (ref 0–37)
BUN: 15 mg/dL (ref 6–23)
CO2: 30 mEq/L (ref 19–32)
CREATININE: 1.25 mg/dL (ref 0.40–1.50)
Calcium: 9.6 mg/dL (ref 8.4–10.5)
Chloride: 105 mEq/L (ref 96–112)
GFR: 80.73 mL/min (ref >60.00)
GLUCOSE: 95 mg/dL (ref 70–99)
Potassium: 4.2 mEq/L (ref 3.5–5.1)
SODIUM: 141 meq/L (ref 135–145)
TOTAL PROTEIN: 7.3 g/dL (ref 6.0–8.3)
Total Bilirubin: 0.5 mg/dL (ref 0.2–1.2)

## 2016-03-17 LAB — LIPID PANEL
CHOLESTEROL: 177 mg/dL (ref 0–200)
HDL: 45.7 mg/dL (ref >39.00)
LDL Cholesterol: 108 mg/dL — ABNORMAL HIGH (ref 0–99)
NONHDL: 131.44
Total CHOL/HDL Ratio: 4
Triglycerides: 116 mg/dL (ref 0.0–149.0)
VLDL: 23.2 mg/dL (ref 0.0–40.0)

## 2016-05-07 ENCOUNTER — Encounter: Payer: Managed Care, Other (non HMO) | Admitting: Primary Care

## 2016-05-14 ENCOUNTER — Encounter: Payer: Self-pay | Admitting: Primary Care

## 2016-05-14 ENCOUNTER — Encounter (INDEPENDENT_AMBULATORY_CARE_PROVIDER_SITE_OTHER): Payer: Self-pay

## 2016-05-14 ENCOUNTER — Ambulatory Visit (INDEPENDENT_AMBULATORY_CARE_PROVIDER_SITE_OTHER): Payer: Managed Care, Other (non HMO) | Admitting: Primary Care

## 2016-05-14 DIAGNOSIS — J4520 Mild intermittent asthma, uncomplicated: Secondary | ICD-10-CM

## 2016-05-14 DIAGNOSIS — J302 Other seasonal allergic rhinitis: Secondary | ICD-10-CM | POA: Diagnosis not present

## 2016-05-14 NOTE — Assessment & Plan Note (Addendum)
Suspect this to be contributing to symptoms. Start Zyrtec daily for the next 2-3 months. He will update if no improvement. Consider Singulair.

## 2016-05-14 NOTE — Patient Instructions (Signed)
Your symptoms are related to allergies.   Start cetirizine (Zyrtec) allergy medication. This may be purchased at the drug store. Start taking this medication everyday.  Use your albuterol inhaler as needed for cough and shortness of breath. Inhale 2 puffs into the lungs every 6 to 8 hours as needed for wheezing and/or shortness of breath.   Please call me if no improvement in your cough in 2 weeks.  It was a pleasure to see you today!

## 2016-05-14 NOTE — Progress Notes (Signed)
Subjective:    Patient ID: Larry Huff, male    DOB: 07-Feb-1971, 46 y.o.   MRN: [id number]  HPI  Mr. Schmutz is a 46 year old male with a history of mild seasonal asthma and seasonal allergies who presents today with a chief complaint of cough. He also reports nasal congestion. His cough has been present since January 22 nd. He thought he had the flu as he had body aches, fevers, decrease in appetite, chills. These symptoms have improved and he's feeling better overall, but he continues with a non productive cough with rhinorrhea, postnasal drip, itchy throat. He's been taking Nyquil, using his albuterol inhaler with improvement to his cough.   Review of Systems  Constitutional: Negative for chills, fatigue and fever.  HENT: Positive for congestion, postnasal drip and rhinorrhea. Negative for ear pain and sore throat.   Respiratory: Positive for cough. Negative for shortness of breath and wheezing.   Cardiovascular: Negative for chest pain.  Allergic/Immunologic: Positive for environmental allergies.       Past Medical History:  Diagnosis Date  . Allergy   . Asthma      Social History   Social History  . Marital status: Married    Spouse name: N/A  . Number of children: N/A  . Years of education: N/A   Occupational History  . Mental Health    Social History Main Topics  . Smoking status: Never Smoker  . Smokeless tobacco: Never Used  . Alcohol use No     Comment: occasionally  . Drug use: No  . Sexual activity: Yes    Partners: Female   Other Topics Concern  . 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       Past Surgical History:  Procedure Laterality Date  . ACHILLES TENDON REPAIR      No family history on  file.  Allergies  Allergen Reactions  . Eggs Or Egg-Derived Products Nausea Only  . Erythromycin Hives  . Milk-Related Compounds Diarrhea    Current Outpatient Prescriptions on File Prior to Visit  Medication Sig Dispense Refill  . albuterol (PROVENTIL HFA;VENTOLIN HFA) 108 (90 Base) MCG/ACT inhaler Inhale 2 puffs into the lungs every 6 (six) hours as needed for wheezing or shortness of breath. (Patient not taking: Reported on 05/14/2016) 1 Inhaler 1  . aspirin EC 81 MG tablet Take 1 tablet (81 mg total) by mouth daily. (Patient not taking: Reported on 03/12/2016)     No current facility-administered medications on file prior to visit.     BP 124/78   Pulse 83   Temp 98 F (36.7 C) (Oral)   Ht 6' 3"  (1.905 m)   Wt 253 lb 6.4 oz (114.9 kg)   SpO2 97%   BMI 31.67 kg/m    Objective:   Physical Exam  Constitutional: He appears well-nourished.  HENT:  Right Ear: Tympanic membrane and ear canal normal.  Left Ear: Tympanic membrane and ear canal normal.  Nose: No mucosal edema. Right sinus exhibits no maxillary sinus tenderness and no frontal sinus tenderness. Left sinus exhibits no maxillary sinus tenderness and no frontal sinus tenderness.  Mouth/Throat: Oropharynx is clear and  moist.  Eyes: Conjunctivae are normal.  Neck: Neck supple.  Cardiovascular: Normal rate and regular rhythm.   Pulmonary/Chest: Effort normal and breath sounds normal. He has no wheezes. He has no rales.  Skin: Skin is warm and dry.          Assessment & Plan:

## 2016-05-14 NOTE — Progress Notes (Signed)
Pre visit review using our clinic review tool, if applicable. No additional management support is needed unless otherwise documented below in the visit note. 

## 2016-05-14 NOTE — Assessment & Plan Note (Signed)
No wheezing on exam. Suspect some symptoms related. Discussed to use albuterol during coughing spells and with SOB. Continue to monitor.

## 2016-07-30 ENCOUNTER — Ambulatory Visit: Payer: Managed Care, Other (non HMO) | Admitting: Family Medicine

## 2016-07-30 ENCOUNTER — Encounter: Payer: Self-pay | Admitting: Family Medicine

## 2016-07-30 ENCOUNTER — Ambulatory Visit (INDEPENDENT_AMBULATORY_CARE_PROVIDER_SITE_OTHER): Payer: Self-pay | Admitting: Family Medicine

## 2016-07-30 VITALS — BP 144/74 | HR 92 | Temp 98.4°F | Wt 246.6 lb

## 2016-07-30 DIAGNOSIS — J069 Acute upper respiratory infection, unspecified: Secondary | ICD-10-CM

## 2016-07-30 DIAGNOSIS — J4520 Mild intermittent asthma, uncomplicated: Secondary | ICD-10-CM

## 2016-07-30 DIAGNOSIS — B9789 Other viral agents as the cause of diseases classified elsewhere: Secondary | ICD-10-CM

## 2016-07-30 MED ORDER — ALBUTEROL SULFATE HFA 108 (90 BASE) MCG/ACT IN AERS: 2.0000 | INHALATION_SPRAY | Freq: Four times a day (QID) | RESPIRATORY_TRACT | 1 refills | Status: DC | PRN

## 2016-07-30 NOTE — Patient Instructions (Signed)
Please try generic Zyrtec for allergies- Cetirizine  For cough you can use over the counter Delsym  If you get worse or if you are not better in 5-7 days, please let me know  Hope you are feeling better soon!

## 2016-07-30 NOTE — Progress Notes (Signed)
   Subjective:    Patient ID: Larry Huff, male    DOB: March 23, 1970, 46 y.o.   MRN: [id number]  HPI This is a 46 yo male who presents today with cough and nasal congestion x 3 days. Went out in the cold after having a hot bath, started feeling off then started to have cough. Some yellow phlegm. Some wheezing, clearing with cough. Has seasonal allergies. Clear nasal drainage. No fever, slight SOB. Slight aches. No sick contacts. No sore throat or ear pain. Feels much better today than yesterday. Feels like equilibrium is off. Has taken Benadryl with some relief. Allergies typically worse in spring, has tried Zyrtec in past, not recently. No other medications for symptoms. Rarely has problems with asthma, usually only with spring allergies. Does not have rescue inhaler at home.   Past Medical History:  Diagnosis Date  . Allergy   . Asthma    Past Surgical History:  Procedure Laterality Date  . ACHILLES TENDON REPAIR     No family history on file. Social History  Substance Use Topics  . Smoking status: Never Smoker  . Smokeless tobacco: Never Used  . Alcohol use No     Comment: occasionally      Review of Systems Per HPI    Objective:   Physical Exam  Constitutional: He is oriented to person, place, and time. He appears well-developed and well-nourished. No distress.  HENT:  Head: Normocephalic and atraumatic.  Right Ear: Tympanic membrane, external ear and ear canal normal.  Left Ear: Tympanic membrane, external ear and ear canal normal.  Nose: Mucosal edema and rhinorrhea present.  Mouth/Throat: Uvula is midline and mucous membranes are normal. Posterior oropharyngeal erythema present. No oropharyngeal exudate, posterior oropharyngeal edema or tonsillar abscesses.  Cardiovascular: Normal rate, regular rhythm and normal heart sounds.   Pulmonary/Chest: Effort normal and breath sounds normal. No respiratory distress. He has no wheezes. He has no rales.  Neurological: He is alert  and oriented to person, place, and time.  Skin: Skin is warm and dry. He is not diaphoretic.  Psychiatric: He has a normal mood and affect. His behavior is normal. Judgment and thought content normal.  Vitals reviewed.        BP (!) 144/74 (BP Location: Right Arm, Patient Position: Sitting, Cuff Size: Large)   Pulse 92   Temp 98.4 F (36.9 C) (Oral)   Wt 246 lb 9.6 oz (111.9 kg)   SpO2 98%   BMI 30.82 kg/m   Assessment & Plan:  1. Viral URI with cough - feels better today, provided written and verbal instructions for symptomatic treatment and return to clinic instructions Patient Instructions  Please try generic Zyrtec for allergies- Cetirizine For cough you can use over the counter Delsym If you get worse or if you are not better in 5-7 days, please let me know  2. Mild intermittent asthma without complication - albuterol (PROVENTIL HFA;VENTOLIN HFA) 108 (90 Base) MCG/ACT inhaler; Inhale 2 puffs into the lungs every 6 (six) hours as needed for wheezing or shortness of breath.  Dispense: 1 Inhaler; Refill: [place], FNP-BC  [facility]  07/30/2016 3:29 PM

## 2017-08-10 ENCOUNTER — Telehealth: Payer: Self-pay | Admitting: Primary Care

## 2017-08-10 ENCOUNTER — Ambulatory Visit (HOSPITAL_COMMUNITY)
Admission: EM | Admit: 2017-08-10 | Discharge: 2017-08-10 | Disposition: A | Discharge status: Home / Self Care | Payer: BLUE CROSS/BLUE SHIELD | Attending: Family Medicine | Admitting: Family Medicine

## 2017-08-10 ENCOUNTER — Encounter (HOSPITAL_COMMUNITY): Payer: Self-pay | Admitting: Family Medicine

## 2017-08-10 DIAGNOSIS — R0789 Other chest pain: Secondary | ICD-10-CM | POA: Diagnosis not present

## 2017-08-10 NOTE — ED Notes (Signed)
EKG given to Dr. Mannie Stabile

## 2017-08-10 NOTE — ED Triage Notes (Addendum)
Pt here for intermittent left side chest pain for the past few days. Denies any recent illness. sts when he reaches down it hurts more. He sts that it feels like gas pockets. Pt had acupuncture yesterday for anxiety. He has a lot of stress right now with daughter having a baby and recently buried grandfather.

## 2017-08-10 NOTE — Telephone Encounter (Signed)
Does anyone else have a 30 minute slot available this week? If not then put him in my 2 pm slot for 05/23 and block the 2:45 pm slot. Thanks!

## 2017-08-10 NOTE — Telephone Encounter (Signed)
CRM received stating following:  Pt is needing an urgent care follow-up appointment as soon as possible with [facility] for chest discomfort with numbness in hands and feet. Do not see any 30 mins appt available until 08/24/17. Please call pt to schedule.   When would you like to schedule this?

## 2017-08-10 NOTE — Discharge Instructions (Signed)
Please go to emergency room if chest pain worsening, not improving with aspirin.  Go to emergency room if you have associated nausea, vomiting, shortness of breath, lightheadedness or dizziness.  Please reestablish care with a new primary provider.  You would benefit from an outpatient evaluation from cardiology.

## 2017-08-11 ENCOUNTER — Emergency Department (HOSPITAL_COMMUNITY): Payer: BLUE CROSS/BLUE SHIELD

## 2017-08-11 ENCOUNTER — Ambulatory Visit: Payer: BLUE CROSS/BLUE SHIELD | Admitting: Primary Care

## 2017-08-11 ENCOUNTER — Emergency Department (HOSPITAL_COMMUNITY)
Admission: EM | Admit: 2017-08-11 | Discharge: 2017-08-11 | Disposition: A | Discharge status: Home / Self Care | Payer: BLUE CROSS/BLUE SHIELD | Attending: Emergency Medicine | Admitting: Emergency Medicine

## 2017-08-11 ENCOUNTER — Encounter (HOSPITAL_COMMUNITY): Payer: Self-pay

## 2017-08-11 DIAGNOSIS — J302 Other seasonal allergic rhinitis: Secondary | ICD-10-CM | POA: Insufficient documentation

## 2017-08-11 DIAGNOSIS — Z79899 Other long term (current) drug therapy: Secondary | ICD-10-CM | POA: Insufficient documentation

## 2017-08-11 DIAGNOSIS — Z7982 Long term (current) use of aspirin: Secondary | ICD-10-CM | POA: Insufficient documentation

## 2017-08-11 DIAGNOSIS — R202 Paresthesia of skin: Secondary | ICD-10-CM | POA: Diagnosis not present

## 2017-08-11 DIAGNOSIS — R200 Anesthesia of skin: Secondary | ICD-10-CM | POA: Diagnosis present

## 2017-08-11 LAB — COMPREHENSIVE METABOLIC PANEL
ALK PHOS: 65 U/L (ref 38–126)
ALT: 25 U/L (ref 17–63)
AST: 27 U/L (ref 15–41)
Albumin: 4.1 g/dL (ref 3.5–5.0)
Anion gap: 6 (ref 5–15)
BUN: 15 mg/dL (ref 6–20)
CALCIUM: 9.3 mg/dL (ref 8.9–10.3)
CO2: 28 mmol/L (ref 22–32)
CREATININE: 1.19 mg/dL (ref 0.61–1.24)
Chloride: 107 mmol/L (ref 101–111)
GFR calc non Af Amer: >60 mL/min (ref >60)
Glucose, Bld: 112 mg/dL — ABNORMAL HIGH (ref 65–99)
Potassium: 4.8 mmol/L (ref 3.5–5.1)
SODIUM: 141 mmol/L (ref 135–145)
Total Bilirubin: 0.7 mg/dL (ref 0.3–1.2)
Total Protein: 6.9 g/dL (ref 6.5–8.1)

## 2017-08-11 LAB — CBC
HEMATOCRIT: 45.2 % (ref 39.0–52.0)
HEMOGLOBIN: 15.1 g/dL (ref 13.0–17.0)
MCH: 30.8 pg (ref 26.0–34.0)
MCHC: 33.4 g/dL (ref 30.0–36.0)
MCV: 92.1 fL (ref 78.0–100.0)
Platelets: 257 10*3/uL (ref 150–400)
RBC: 4.91 MIL/uL (ref 4.22–5.81)
RDW: 13.3 % (ref 11.5–15.5)
WBC: 3.8 10*3/uL — ABNORMAL LOW (ref 4.0–10.5)

## 2017-08-11 LAB — DIFFERENTIAL
Abs Immature Granulocytes: 0 10*3/uL (ref 0.0–0.1)
BASOS ABS: 0 10*3/uL (ref 0.0–0.1)
Basophils Relative: 1 %
Eosinophils Absolute: 0.1 10*3/uL (ref 0.0–0.7)
Eosinophils Relative: 4 %
Immature Granulocytes: 1 %
LYMPHS PCT: 42 %
Lymphs Abs: 1.6 10*3/uL (ref 0.7–4.0)
MONO ABS: 0.3 10*3/uL (ref 0.1–1.0)
Monocytes Relative: 9 %
NEUTROS ABS: 1.7 10*3/uL (ref 1.7–7.7)
Neutrophils Relative %: 43 %

## 2017-08-11 LAB — I-STAT CHEM 8, ED
BUN: 18 mg/dL (ref 6–20)
CALCIUM ION: 1.21 mmol/L (ref 1.15–1.40)
CHLORIDE: 104 mmol/L (ref 101–111)
Creatinine, Ser: 1.2 mg/dL (ref 0.61–1.24)
Glucose, Bld: 107 mg/dL — ABNORMAL HIGH (ref 65–99)
HCT: 45 % (ref 39.0–52.0)
Hemoglobin: 15.3 g/dL (ref 13.0–17.0)
Potassium: 4.7 mmol/L (ref 3.5–5.1)
Sodium: 141 mmol/L (ref 135–145)
TCO2: 27 mmol/L (ref 22–32)

## 2017-08-11 LAB — I-STAT TROPONIN, ED: Troponin i, poc: 0.02 ng/mL (ref 0.00–0.08)

## 2017-08-11 LAB — APTT: APTT: 29 s (ref 24–36)

## 2017-08-11 LAB — PROTIME-INR
INR: 1.01
Prothrombin Time: 13.2 seconds (ref 11.4–15.2)

## 2017-08-11 NOTE — ED Notes (Signed)
Neuro here

## 2017-08-11 NOTE — ED Notes (Signed)
Still in MRI family in room

## 2017-08-11 NOTE — ED Triage Notes (Signed)
Pt reports that he was seen at [facility] today for CP and told is he experienced any numbness then to come to the ED, reports that at 4am he began to have numbness on his L side of his face, L hand and L great toe which have all now resolved. Neuro intact bilaterally

## 2017-08-11 NOTE — ED Provider Notes (Signed)
Larry Huff    CSN: [id number] Arrival date & time: 08/10/17  1229     History   Chief Complaint Chief Complaint  Patient presents with  . Chest Pain    HPI Larry Huff is a 47 y.o. male history of asthma presenting today for evaluation of chest pain.  Patient states that he has had intermittent chest pain on the left side for the past 2 to 3 days.  Sensation will come and go.  Feels like a gas pocket/pressure sensation.  Also has noted numbness in his hand and toe on the left side.  Denies numbness and chest pain at time of visit.  Patient typically takes an aspirin which helps improve the discomfort.  Is unsure if this is related to exertion or not.  States that he participates in martial arts approximately twice a week.  Denies any associated nausea, vomiting, shortness of breath, lightheadedness or dizziness.  Denies history of hypertension diabetes.  Denies family history of family member dying from an early age of an MI.  Patient does state he had a previous DVT when he tore his Achilles tendon.  Patient has not seen a PCP in May while.  HPI  Past Medical History:  Diagnosis Date  . Allergy   . Asthma     Patient Active Problem List   Diagnosis Date Noted  . Seasonal allergies 05/14/2016  . Preventative health care 03/12/2016  . Asthma 03/12/2016  . Lactose intolerance 03/14/2015  . Obesity, Class I, BMI 30-34.9 03/14/2015    Past Surgical History:  Procedure Laterality Date  . ACHILLES TENDON REPAIR         Home Medications    Prior to Admission medications   Medication Sig Start Date End Date Taking? Authorizing Provider  albuterol (PROVENTIL HFA;VENTOLIN HFA) 108 (90 Base) MCG/ACT inhaler Inhale 2 puffs into the lungs every 6 (six) hours as needed for wheezing or shortness of breath. 07/30/16   Elby Beck, FNP  aspirin EC 81 MG tablet Take 1 tablet (81 mg total) by mouth daily. Patient not taking: Reported on 03/12/2016 07/06/13   Donne Hazel, MD    Family History History reviewed. No pertinent family history.  Social History Social History   Tobacco Use  . Smoking status: Never Smoker  . Smokeless tobacco: Never Used  Substance Use Topics  . Alcohol use: No    Comment: occasionally  . Drug use: No     Allergies   Eggs or egg-derived products; Erythromycin; and Milk-related compounds   Review of Systems Review of Systems  Constitutional: Negative for activity change, appetite change and fever.  HENT: Negative for trouble swallowing.   Eyes: Negative for visual disturbance.  Respiratory: Negative for shortness of breath.   Cardiovascular: Positive for chest pain. Negative for palpitations and leg swelling.  Gastrointestinal: Negative for abdominal pain, nausea and vomiting.  Musculoskeletal: Negative for arthralgias, gait problem, myalgias, neck pain and neck stiffness.  Skin: Negative for wound.  Neurological: Positive for numbness. Negative for dizziness, syncope, speech difficulty, weakness, light-headedness and headaches.     Physical Exam Triage Vital Signs ED Triage Vitals  Enc Vitals Group     BP 08/10/17 1301 130/78     Pulse Rate 08/10/17 1301 61     Resp 08/10/17 1301 18     Temp 08/10/17 1301 98.2 F (36.8 C)     Temp src --      SpO2 08/10/17 1301 100 %  Weight --      Height --      Head Circumference --      Peak Flow --      Pain Score 08/10/17 1259 0     Pain Loc --      Pain Edu? --      Excl. in [place]? --    No data found.  Updated Vital Signs BP 130/78   Pulse 61   Temp 98.2 F (36.8 C)   Resp 18   SpO2 100%   Visual Acuity Right Eye Distance:   Left Eye Distance:   Bilateral Distance:    Right Eye Near:   Left Eye Near:    Bilateral Near:     Physical Exam  Constitutional: He is oriented to person, place, and time. He appears well-developed and well-nourished.  HENT:  Head: Normocephalic and atraumatic.  Eyes: Pupils are equal, round, and reactive  to light. Conjunctivae and EOM are normal.  Neck: Neck supple.  Cardiovascular: Normal rate and regular rhythm.  No murmur heard. Pulmonary/Chest: Effort normal and breath sounds normal. No respiratory distress.  Breathing comfortably at rest, CTABL, no wheezing, rales or other adventitious sounds auscultated  Chest discomfort not reproducible on exam  Abdominal: Soft. There is no tenderness.  Musculoskeletal: He exhibits no edema.  No signs of DVT, no lower extremity swelling  Neurological: He is alert and oriented to person, place, and time.  Distal sensation intact  Skin: Skin is warm and dry.  Psychiatric: He has a normal mood and affect.  Nursing note and vitals reviewed.    UC Treatments / Results  Labs (all labs ordered are listed, but only abnormal results are displayed) Labs Reviewed - No data to display  EKG None  Radiology No results found.  Procedures Procedures (including critical care time)  Medications Ordered in UC Medications - No data to display  Initial Impression / Assessment and Plan / UC Course  I have reviewed the triage vital signs and the nursing notes.  Pertinent labs & imaging results that were available during my care of the patient were reviewed by me and considered in my medical decision making (see chart for details).     Discussed with Dr. Mannie Stabile.  EKG abnormal at baseline, no new changes from previous EKG.  Stable T wave inversions throughout EKG.  Patient without chest pain at this time.  Negative risk factors for MI.  No tachycardia, oxygen 100%, feel this is less likely a PE.  Will recommend patient to follow-up with PCP to reestablish care and for outpatient cardiology work-up.  Emergency room for worsening chest pain, more persistent or development of other symptoms in association with the chest pain.Discussed strict return precautions. Patient verbalized understanding and is agreeable with plan.  Final Clinical Impressions(s) / UC  Diagnoses   Final diagnoses:  Atypical chest pain     Discharge Instructions     Please go to emergency room if chest pain worsening, not improving with aspirin.  Go to emergency room if you have associated nausea, vomiting, shortness of breath, lightheadedness or dizziness.  Please reestablish care with a new primary provider.  You would benefit from an outpatient evaluation from cardiology.   ED Prescriptions    None     Controlled Substance Prescriptions [place] Controlled Substance Registry consulted? Not Applicable   Janith Lima, [place] 08/11/17 [phone]

## 2017-08-11 NOTE — ED Notes (Signed)
Pt back from MRI 

## 2017-08-11 NOTE — ED Provider Notes (Signed)
[facility] Provider Note   CSN: [id number] Arrival date & time: 08/11/17  0540     History   Chief Complaint Chief Complaint  Patient presents with  . Numbness    HPI Larry Huff is a 47 y.o. male with a past medical history of asthma, who presents to ED for evaluation of 1 hour episode of numbness/tingling of the left side of his face, left hand, left great toe.  Symptoms occurred 5 hours ago.  They resolved spontaneously after 1 hour.  Patient has been taking aspirin daily because his mother told him to "take it to keep your blood thin."  However, he did not take it after the numbness occurred.  States that symptoms have completely resolved.  This was unwitnessed by anyone.  He did not notice a facial droop.  He denies any previous or current headache, vision changes, injuries or falls, changes in gait.  Patient appears to be a poor historian.  However, he states that he was seen at the urgent care earlier yesterday for chest pain and "tightness" on the left side of his body.  He states that the symptoms that occurred 5 hours ago were "different and more intense." Of note, patient was admitted for TIA/stroke work-up approximately 4 years ago.  CT, MRI and MRA of the brain done at that time was unremarkable.  He presented with similar symptoms at that time.  He was told to take aspirin 81 mg since then but has not followed up with a neurologist.  HPI  Past Medical History:  Diagnosis Date  . Allergy   . Asthma     Patient Active Problem List   Diagnosis Date Noted  . Seasonal allergies 05/14/2016  . Preventative health care 03/12/2016  . Asthma 03/12/2016  . Lactose intolerance 03/14/2015  . Obesity, Class I, BMI 30-34.9 03/14/2015    Past Surgical History:  Procedure Laterality Date  . ACHILLES TENDON REPAIR          Home Medications    Prior to Admission medications   Medication Sig Start Date End Date Taking? Authorizing  Provider  albuterol (PROVENTIL HFA;VENTOLIN HFA) 108 (90 Base) MCG/ACT inhaler Inhale 2 puffs into the lungs every 6 (six) hours as needed for wheezing or shortness of breath. 07/30/16  Yes Elby Beck, FNP  aspirin 325 MG tablet Take 325 mg by mouth daily as needed for mild pain or moderate pain.   Yes [provider]  aspirin EC 81 MG tablet Take 1 tablet (81 mg total) by mouth daily. Patient not taking: Reported on 03/12/2016 07/06/13   Donne Hazel, MD    Family History No family history on file.  Social History Social History   Tobacco Use  . Smoking status: Never Smoker  . Smokeless tobacco: Never Used  Substance Use Topics  . Alcohol use: No    Comment: occasionally  . Drug use: No     Allergies   Eggs or egg-derived products; Erythromycin; and Milk-related compounds   Review of Systems Review of Systems  Constitutional: Negative for appetite change, chills and fever.  HENT: Negative for ear pain, rhinorrhea, sneezing and sore throat.   Eyes: Negative for photophobia and visual disturbance.  Respiratory: Negative for cough, chest tightness, shortness of breath and wheezing.   Cardiovascular: Negative for chest pain and palpitations.  Gastrointestinal: Negative for abdominal pain, blood in stool, constipation, diarrhea, nausea and vomiting.  Genitourinary: Negative for dysuria, hematuria and urgency.  Musculoskeletal: Negative for myalgias.  Skin: Negative for rash.  Neurological: Positive for numbness. Negative for dizziness, weakness and light-headedness.     Physical Exam Updated Vital Signs BP 128/83   Pulse (!) 54   Temp 97.8 F (36.6 C) (Oral)   Resp 12   SpO2 100%   Physical Exam  Constitutional: He is oriented to person, place, and time. He appears well-developed and well-nourished. No distress.  Nontoxic appearing. Anxious.  HENT:  Head: Normocephalic and atraumatic.  Nose: Nose normal.  Eyes: Pupils are equal, round, and  reactive to light. Conjunctivae and EOM are normal. Right eye exhibits no discharge. Left eye exhibits no discharge. No scleral icterus.  EOMs intact.  Neck: Normal range of motion. Neck supple.  Cardiovascular: Normal rate, regular rhythm, normal heart sounds and intact distal pulses. Exam reveals no gallop and no friction rub.  No murmur heard. Pulmonary/Chest: Effort normal and breath sounds normal. No respiratory distress.  Abdominal: Soft. Bowel sounds are normal. He exhibits no distension. There is no tenderness. There is no guarding.  Musculoskeletal: Normal range of motion. He exhibits no edema.  Neurological: He is alert and oriented to person, place, and time. No cranial nerve deficit or sensory deficit. He exhibits normal muscle tone. Coordination normal.  Pupils reactive. No facial asymmetry noted. Cranial nerves appear grossly intact. Sensation intact to light touch on face, BUE and BLE. Strength 5/5 in BUE and BLE. Normal finger to nose coordination bilaterally. No pronator drift noted.  Skin: Skin is warm and dry. No rash noted.  Psychiatric: He has a normal mood and affect.  Nursing note and vitals reviewed.    ED Treatments / Results  Labs (all labs ordered are listed, but only abnormal results are displayed) Labs Reviewed  CBC - Abnormal; Notable for the following components:      Result Value   WBC 3.8 (*)    All other components within normal limits  COMPREHENSIVE METABOLIC PANEL - Abnormal; Notable for the following components:   Glucose, Bld 112 (*)    All other components within normal limits  I-STAT CHEM 8, ED - Abnormal; Notable for the following components:   Glucose, Bld 107 (*)    All other components within normal limits  PROTIME-INR  APTT  DIFFERENTIAL  I-STAT TROPONIN, ED  CBG MONITORING, ED    EKG EKG Interpretation  Date/Time:  Thursday Aug 11 2017 06:07:14 EDT Ventricular Rate:  72 PR Interval:  134 QRS Duration: 90 QT Interval:  424 QTC  Calculation: 464 R Axis:   70 Text Interpretation:  Normal sinus rhythm with sinus arrhythmia Possible Left atrial enlargement Left ventricular hypertrophy with repolarization abnormality Cannot rule out Septal infarct , age undetermined Abnormal ECG similar to previous Confirmed by Theotis Burrow [phone]) on 08/11/2017 6:13:08 AM   Radiology Ct Head Wo Contrast  Result Date: 08/11/2017 CLINICAL DATA:  Left-sided facial numbness. EXAM: CT HEAD WITHOUT CONTRAST TECHNIQUE: Contiguous axial images were obtained from the base of the skull through the vertex without intravenous contrast. COMPARISON:  CT scan of July 06, 2013. FINDINGS: Brain: No evidence of acute infarction, hemorrhage, hydrocephalus, extra-axial collection or mass lesion/mass effect. Vascular: No hyperdense vessel or unexpected calcification. Skull: Normal. Negative for fracture or focal lesion. Sinuses/Orbits: No acute finding. Other: None. IMPRESSION: Normal head CT. Electronically Signed   By: Marijo Conception, M.D.   On: 08/11/2017 10:04   Mr Brain Wo Contrast  Result Date: 08/11/2017 CLINICAL DATA:  Left-sided numbness EXAM: MRI  HEAD WITHOUT CONTRAST TECHNIQUE: Multiplanar, multiecho pulse sequences of the brain and surrounding structures were obtained without intravenous contrast. COMPARISON:  CT 08/11/2017, MRI 07/06/2013 FINDINGS: Brain: No acute infarction, hemorrhage, hydrocephalus, extra-axial collection or mass lesion. Vascular: Normal arterial flow void Skull and upper cervical spine: Negative Sinuses/Orbits: Negative Other: None IMPRESSION: Normal MRI head Electronically Signed   By: Franchot Gallo M.D.   On: 08/11/2017 15:30    Procedures Procedures (including critical care time)  Medications Ordered in ED Medications - No data to display   Initial Impression / Assessment and Plan / ED Course  I have reviewed the triage vital signs and the nursing notes.  Pertinent labs & imaging results that were available during  my care of the patient were reviewed by me and considered in my medical decision making (see chart for details).  Clinical Course as of Aug 11 1532  Thu Aug 11, 2017  1120 Neuro states will come and evaluate the patient.   [HK]  1226 Neurology recommends MRI brain; if negative, dispo home.   [HK]    Clinical Course User Index [HK] Delia Heady, PA-C    Patient presents to ED for evaluation of 1 hour episode of numbness/tingling of left side of face, left hand, left great toe.  Symptoms occurred approximately 5 hours prior to my evaluation initially.  They resolve spontaneously after 1 hour.  Did not take any medications to help with symptoms.  He did not notice a facial droop.  Denies any gait changes, history of CVA.  Patient was admitted for TIA/stroke work-up proximally 4 years ago for almost exact similar symptoms.  CT, MRI and MRA of the brain done at that time was unremarkable.  Patient has been under significant stress with the birth of a new grandchild and passing of a family member.  He currently has no deficits on his neurological examination.  He denies any headache.  He denies any vision changes.  He is not hypertensive.  He has no history of hypertension, hyperlipidemia or family history of CVA.  Her lab work including CBC, CMP, PT/INR, troponin and CBG unremarkable.  EKG with no changes from prior tracings.  CT of the head was negative.  I consulted neurology who recommended MRI.  They believe that patient increasing stress is manifesting itself with these feelings of numbness/tingling.  This was based on his prior visit 4 years ago as well.  I would agree with this.  MRI of the brain returned as negative.  Will advise patient to follow-up with his primary care provider for further evaluation and to return to ED for any severe worsening symptoms.    Portions of this note were generated with [employer]. Dictation errors may occur despite best attempts at  proofreading.   Final Clinical Impressions(s) / ED Diagnoses   Final diagnoses:  Paresthesias    ED Discharge Orders    None       Delia Heady, PA-C 08/11/17 [place], MD 08/11/17 [phone]

## 2017-08-11 NOTE — ED Notes (Signed)
Patient transported to MRI 

## 2017-08-11 NOTE — ED Notes (Addendum)
Numbness in left big toe , left cheek and  Left hand  When he woke up this am , has all resolved today , happened yesterday but it went away some saw the ucc and was told to F?U with his dr has appointment today at 2

## 2017-08-11 NOTE — ED Notes (Signed)
ED Provider at bedside. 

## 2017-08-11 NOTE — Discharge Instructions (Signed)
The CT and MRI of your brain today were normal. Follow-up with your primary care provider for further evaluation.

## 2017-08-11 NOTE — Consult Note (Addendum)
NEURO HOSPITALIST CONSULT NOTE   Requestig physician: Dr. Regenia Skeeter   Reason for Consult: left side of numbness and tingling   History obtained from:  Patient    HPI:                                                                                                                                          Larry Huff is an 47 y.o. male  With PMH allergy and asthma.  Who presents to [facility] ED with complaints of chest pain, left arm, left great toe, and left facial numbness and tingling.  Patient states that yesterday he went to urgent care with CP, left great toe, and  left hand numbness and tingling.  An EKG was done that showed no changes from previous EKG, and patient was sent home with recommendations to follow-up with PCP and outpatient cardiology work-up.  He was also given instructions to return to the emergency room for worsening or persistent chest pain, or more persistent associated symptoms.  This morning when he woke up he had the same CP,numbness and tingling in the left great toe, in has left hand but it was now also ion the left side of his face.  He states that  the paresthesias lasted about an hour then resolved and have not returned.  Patient states that he did feel like his equilibrium was off for about 1.5 hours that has also resolved. Denies SOB, weakness. Patient when asked admitted that he has been under a lot of stress.  Patient also went to the acupuncturist yesterday and was told that he had a lot of anxiety.  In 2015  When he had these same symptoms he als admitted that he was also under a lot of stress at that time.  CMP: elevated glucose, CT of head: no abnormalities MRI brain: ordered Past Medical History:  Diagnosis Date  . Allergy   . Asthma     Past Surgical History:  Procedure Laterality Date  . ACHILLES TENDON REPAIR      No family history on file.           Family History: maternal grandmother died from a blood clot  Social History:   reports that he has never smoked. He has never used smokeless tobacco. He reports that he does not drink alcohol or use drugs.  Allergies  Allergen Reactions  . Eggs Or Egg-Derived Products Nausea Only  . Erythromycin Hives  . Milk-Related Compounds Diarrhea    MEDICATIONS:  No current facility-administered medications for this encounter.    Current Outpatient Medications  Medication Sig Dispense Refill  . albuterol (PROVENTIL HFA;VENTOLIN HFA) 108 (90 Base) MCG/ACT inhaler Inhale 2 puffs into the lungs every 6 (six) hours as needed for wheezing or shortness of breath. 1 Inhaler 1  . aspirin 325 MG tablet Take 325 mg by mouth daily as needed for mild pain or moderate pain.    Marland Kitchen aspirin EC 81 MG tablet Take 1 tablet (81 mg total) by mouth daily. (Patient not taking: Reported on 03/12/2016)        ROS:                                                                                                                                       History obtained from the patient  General ROS: negative for - chills, fatigue, fever, night sweats, weight gain or weight loss Ophthalmic ROS: negative for - blurry vision, double vision, eye pain or loss of vision Respiratory ROS: negative for - cough, hemoptysis, shortness of breath or wheezing Cardiovascular ROS: negative for - chest pain, dyspnea on exertion, edema or irregular heartbeat at time of exam. CP brought patient to ER Musculoskeletal ROS: negative for - joint swelling or muscular weakness Neurological ROS: as noted in HPI Dermatological ROS: negative for rash and skin lesion changes   Blood pressure 130/80, pulse (!) 53, temperature 97.8 F (36.6 C), temperature source Oral, resp. rate 16, SpO2 100 %.   General Examination:                                                                                                        Physical Exam  HEENT-  Normocephalic, no lesions, without obvious abnormality.  Normal external eye and conjunctiva.   Lungs-, no excessive working breathing.  Saturations within normal limits Extremities- Warm, dry and intact Musculoskeletal-no joint tenderness, deformity or swelling Skin-warm and dry,   Neurological Examination Mental Status: Alert, oriented, thought content appropriate.  Speech fluent without evidence of aphasia.  Able to follow 3 step commands without difficulty. Cranial Nerves: II:; Visual fields grossly normal,  III,IV, VI: ptosis not present, extra-ocular motions intact bilaterally pupils equal, round, reactive to light and accommodation V,VII: smile symmetric, facial light touch sensation normal bilaterally VIII: hearing normal bilaterally IX,X: uvula rises symmetrically XI: bilateral shoulder shrug XII: midline tongue extension Motor: Right : Upper extremity   5/5    Left:     Upper extremity  5/5  Lower extremity   5/5     Lower extremity   5/5 Tone and bulk:normal tone throughout; no atrophy noted Sensory: Pinprick and light touch intact throughout, bilaterally Deep Tendon Reflexes: 2+ and symmetric throughout Plantars: Right: downgoing   Left: downgoing Cerebellar: normal finger-to-nose, normal rapid alternating movements and normal heel-to-shin test    Lab Results: Basic Metabolic Panel: Recent Labs  Lab 08/11/17 0617 08/11/17 0624  NA 141 141  K 4.8 4.7  CL 107 104  CO2 28  --   GLUCOSE 112* 107*  BUN 15 18  CREATININE 1.19 1.20  CALCIUM 9.3  --     CBC: Recent Labs  Lab 08/11/17 0617 08/11/17 0624  WBC 3.8*  --   NEUTROABS 1.7  --   HGB 15.1 15.3  HCT 45.2 45.0  MCV 92.1  --   PLT 257  --     Cardiac Enzymes: No results for input(s): CKTOTAL, CKMB, CKMBINDEX, TROPONINI in the last 168 hours.  Lipid Panel: No results for input(s): CHOL, TRIG, HDL, CHOLHDL, VLDL, LDLCALC in the last 168 hours.  Imaging: Ct Head Wo  Contrast  Result Date: 08/11/2017 CLINICAL DATA:  Left-sided facial numbness. EXAM: CT HEAD WITHOUT CONTRAST TECHNIQUE: Contiguous axial images were obtained from the base of the skull through the vertex without intravenous contrast. COMPARISON:  CT scan of July 06, 2013. FINDINGS: Brain: No evidence of acute infarction, hemorrhage, hydrocephalus, extra-axial collection or mass lesion/mass effect. Vascular: No hyperdense vessel or unexpected calcification. Skull: Normal. Negative for fracture or focal lesion. Sinuses/Orbits: No acute finding. Other: None. IMPRESSION: Normal head CT. Electronically Signed   By: Marijo Conception, M.D.   On: 08/11/2017 10:04    08/11/2017, 1:32 PM   Impression: Becky Berberian is a 47 year old African-American male with PMH allergy and asthma.  Who presents to the ED with chest pain and paresthesias of left face, left hand, and left great toe.  Physical exam is without any neurological deficits.  CT of head was normal.  Ordered MRI of brain; if that is also negative will not recommend any additional testing.  Paresthesias most likely due to high stress level, and this is how patient presents his stress.  Will recommend discharge.  Recommendations:  --MRI of brain; if normal discharge -- F/u with PCP      Laurey Morale, MSN, NP-C [facility] [phone]   NEUROHOSPITALIST ADDENDUM Seen and examined the patient. I have reviewed the contents of history and physical exam as documented by PA/ARNP/Resident and agree with above documentation.  I have discussed and formulated the above plan as documented. Edits to the note have been made as needed.    Karena Addison Aroor MD [facility] [phone]   If 7pm to 7am, please call on call as listed on AMION.

## 2017-08-17 ENCOUNTER — Ambulatory Visit (INDEPENDENT_AMBULATORY_CARE_PROVIDER_SITE_OTHER): Payer: BLUE CROSS/BLUE SHIELD | Admitting: Family Medicine

## 2017-08-17 ENCOUNTER — Encounter: Payer: Self-pay | Admitting: Family Medicine

## 2017-08-17 VITALS — BP 118/82 | HR 61 | Temp 98.4°F | Ht 75.0 in | Wt 237.8 lb

## 2017-08-17 DIAGNOSIS — S46812A Strain of other muscles, fascia and tendons at shoulder and upper arm level, left arm, initial encounter: Secondary | ICD-10-CM

## 2017-08-17 DIAGNOSIS — F439 Reaction to severe stress, unspecified: Secondary | ICD-10-CM

## 2017-08-17 NOTE — Patient Instructions (Signed)
Good to see you today  You have strained your trapezius muscle  For pain and inflammation, you can take ibuprofen 2-3 tablets every 8 to 12 hours as needed  Do gentle stretches of your shoulders and neck several times a day  If pain gets worse or you develop new symptoms, please follow up

## 2017-08-17 NOTE — Progress Notes (Signed)
   Subjective:    Patient ID: Larry Huff, male    DOB: 02-16-1971, 47 y.o.   MRN: [id number]  HPI This is a 47 yo male who presents today for follow up of recent ER visit. Was seen 08/11/17 with paresthesias and had negative MRI and CT of brain. Feels better today. Had some tingling the other day of his left side of his face. Is currently having intermittent left sided neck pain. Only takes occasional aspirin, either 81 or 325 mg. Has noticed improvement of pain with asa 325. He works for the [employer] and does repetitive lifting. He is left handed. He works out, does Web designer, some stretching. Has backed off of working out due to pain. No weakness.  He has been under increased stress and has had paresthesias in past with increased stress. His grandfather recently passed away, his 78 yo son just had a baby and he has work stress. He feels like things are getting a little better stress wise and he is trying to get back to some stress reducing activities.  Has been watching diet and exercising for weight loss.   Past Medical History:  Diagnosis Date  . Allergy   . Asthma    Past Surgical History:  Procedure Laterality Date  . ACHILLES TENDON REPAIR     No family history on file. Social History   Tobacco Use  . Smoking status: Never Smoker  . Smokeless tobacco: Never Used  Substance Use Topics  . Alcohol use: No    Comment: occasionally  . Drug use: No      Review of Systems Per HPI    Objective:   Physical Exam  Constitutional: He is oriented to person, place, and time. He appears well-developed and well-nourished. No distress.  HENT:  Head: Normocephalic.  Eyes: Conjunctivae are normal.  Cardiovascular: Normal rate.  Pulmonary/Chest: Effort normal.  Musculoskeletal: He exhibits no edema.       Left shoulder: Normal.       Cervical back: He exhibits decreased range of motion (left rotation) and tenderness (left trapezius). He exhibits no bony tenderness and no  swelling.  Neurological: He is alert and oriented to person, place, and time.  Skin: Skin is warm and dry. He is not diaphoretic.  Psychiatric: He has a normal mood and affect. His behavior is normal. Judgment and thought content normal.  Vitals reviewed.     BP 118/82 (BP Location: Right Arm, Patient Position: Sitting, Cuff Size: Large)   Pulse 61   Temp 98.4 F (36.9 C) (Oral)   Ht 6' 3"  (1.905 m)   Wt 237 lb 12 oz (107.8 kg)   SpO2 98%   BMI 29.72 kg/m  Wt Readings from Last 3 Encounters:  08/17/17 237 lb 12 oz (107.8 kg)  07/30/16 246 lb 9.6 oz (111.9 kg)  05/14/16 253 lb 6.4 oz (114.9 kg)         Assessment & Plan:  1. Strain of left trapezius muscle, initial encounter - reviewed results of recent CT/MRI with patient - Provided written and verbal information regarding diagnosis and treatment. - encouraged ibuprofen 400-600 mg q 8 to 12 hours prn, gentle stretching, heat, mindful body mechanics  2. Situational stress - he reports that he is currently coping ok and working on incorporating more stress reduction activities  - RTC if symptoms worsen   Clarene Reamer, FNP-BC  [facility], [place]  08/17/2017 8:34 AM

## 2017-12-13 ENCOUNTER — Encounter: Payer: Self-pay | Admitting: Family Medicine

## 2017-12-13 ENCOUNTER — Encounter (INDEPENDENT_AMBULATORY_CARE_PROVIDER_SITE_OTHER): Payer: Self-pay

## 2017-12-13 ENCOUNTER — Ambulatory Visit: Payer: Self-pay | Admitting: Family Medicine

## 2017-12-13 DIAGNOSIS — J4521 Mild intermittent asthma with (acute) exacerbation: Secondary | ICD-10-CM

## 2017-12-13 MED ORDER — PREDNISONE 20 MG PO TABS: ORAL_TABLET | ORAL | 0 refills | Status: DC

## 2017-12-13 MED ORDER — IPRATROPIUM-ALBUTEROL 0.5-2.5 (3) MG/3ML IN SOLN
3.0000 mL | Freq: Once | RESPIRATORY_TRACT | Status: AC
  Administered 2017-12-13: 3 mL via RESPIRATORY_TRACT

## 2017-12-13 MED ORDER — DOXYCYCLINE HYCLATE 100 MG PO TABS: 100.0000 mg | ORAL_TABLET | Freq: Two times a day (BID) | ORAL | 0 refills | Status: DC

## 2017-12-13 MED ORDER — ALBUTEROL SULFATE HFA 108 (90 BASE) MCG/ACT IN AERS: 2.0000 | INHALATION_SPRAY | Freq: Four times a day (QID) | RESPIRATORY_TRACT | 1 refills | Status: DC | PRN

## 2017-12-13 NOTE — Assessment & Plan Note (Signed)
Likely bacterial infection on top of viral infection given fever late in illness. Treat with doxy given allergy to mycins. Complete pred taper and use albuterol as needed.   neb in office improved air movement.

## 2017-12-13 NOTE — Progress Notes (Signed)
Subjective:    Patient ID: Larry Huff, male    DOB: 1970-03-27, 47 y.o.   MRN: [id number]  Cough  This is a new problem. The current episode started in the past 7 days. The problem has been gradually worsening. The cough is productive of sputum. Associated symptoms include chest pain, a fever, myalgias, a sore throat, shortness of breath and wheezing. Pertinent negatives include no ear pain or nasal congestion. Associated symptoms comments: Back pain   subjective fever last night. The symptoms are aggravated by lying down. Risk factors: nonsmoker. Treatments tried: mucinsex for cold and flu, motrin. The treatment provided moderate relief. His past medical history is significant for asthma. There is no history of COPD.  asthma as a child   Fever   Associated symptoms include chest pain, coughing, a sore throat and wheezing. Pertinent negatives include no ear pain.  Shortness of Breath  Associated symptoms include chest pain, a fever, a sore throat and wheezing. Pertinent negatives include no ear pain. His past medical history is significant for asthma. There is no history of COPD. ( asthma as a child )  occ sneeze  Fever last in illness on day 5  HX of seasonal allergies and asthma  Blood pressure 118/70, pulse 73, temperature 99.3 F (37.4 C), temperature source Oral, height 6' 3"  (1.905 m), weight 224 lb 8 oz (101.8 kg), SpO2 97 %.   Review of Systems  Constitutional: Positive for fever.  HENT: Positive for sore throat. Negative for ear pain.   Respiratory: Positive for cough, shortness of breath and wheezing.   Cardiovascular: Positive for chest pain.  Musculoskeletal: Positive for myalgias.       Objective:   Physical Exam  Constitutional: Vital signs are normal. He appears well-developed and well-nourished.  Non-toxic appearance. He does not appear ill. No distress.  HENT:  Head: Normocephalic and atraumatic.  Right Ear: Hearing, tympanic membrane, external ear and ear  canal normal. No tenderness. No foreign bodies. Tympanic membrane is not retracted and not bulging.  Left Ear: Hearing, tympanic membrane, external ear and ear canal normal. No tenderness. No foreign bodies. Tympanic membrane is not retracted and not bulging.  Nose: Nose normal. No mucosal edema or rhinorrhea. Right sinus exhibits no maxillary sinus tenderness and no frontal sinus tenderness. Left sinus exhibits no maxillary sinus tenderness and no frontal sinus tenderness.  Mouth/Throat: Uvula is midline, oropharynx is clear and moist and mucous membranes are normal. Normal dentition. No dental caries. No oropharyngeal exudate or tonsillar abscesses.  Eyes: Pupils are equal, round, and reactive to light. Conjunctivae, EOM and lids are normal. Lids are everted and swept, no foreign bodies found.  Neck: Trachea normal, normal range of motion and phonation normal. Neck supple. Carotid bruit is not present. No thyroid mass and no thyromegaly present.  Cardiovascular: Normal rate, regular rhythm, S1 normal, S2 normal, normal heart sounds, intact distal pulses and normal pulses. Exam reveals no gallop.  No murmur heard. Pulmonary/Chest: Effort normal. No respiratory distress. He has wheezes in the right upper field, the right middle field, the right lower field, the left upper field, the left middle field and the left lower field. He has no rhonchi. He has no rales.  Improved following Duo neb  Abdominal: Soft. Normal appearance and bowel sounds are normal. There is no hepatosplenomegaly. There is no tenderness. There is no rebound, no guarding and no CVA tenderness. No hernia.  Neurological: He is alert. He has normal reflexes.  Skin: Skin  is warm, dry and intact. No rash noted.  Psychiatric: He has a normal mood and affect. His speech is normal and behavior is normal. Judgment normal.          Assessment & Plan:

## 2017-12-13 NOTE — Patient Instructions (Addendum)
Rest. Fluids.  Use albuterol as needed for wheeze and shortness of breath.  Complete prednisone taper. Complete course of antibiotics.  Go to ER with severe shortness of breath.

## 2017-12-30 ENCOUNTER — Ambulatory Visit: Payer: Self-pay | Admitting: Family Medicine

## 2017-12-30 ENCOUNTER — Encounter: Payer: Self-pay | Admitting: Family Medicine

## 2017-12-30 VITALS — BP 130/80 | HR 71 | Temp 98.5°F | Ht 75.0 in | Wt 231.0 lb

## 2017-12-30 DIAGNOSIS — R05 Cough: Secondary | ICD-10-CM

## 2017-12-30 DIAGNOSIS — R059 Cough, unspecified: Secondary | ICD-10-CM

## 2017-12-30 DIAGNOSIS — J309 Allergic rhinitis, unspecified: Secondary | ICD-10-CM

## 2017-12-30 MED ORDER — HYDROCODONE-HOMATROPINE 5-1.5 MG/5ML PO SYRP: 5.0000 mL | ORAL_SOLUTION | Freq: Every evening | ORAL | 0 refills | Status: DC | PRN

## 2017-12-30 NOTE — Progress Notes (Signed)
Subjective:    Patient ID: Larry Huff, male    DOB: Jan 13, 1971, 47 y.o.   MRN: [id number]  HPI This is a 47 yo male who presents today with continued cough and fatigue. Was seen 12/13/17 and treated with doxycycline, prednisone. Has noticed improvement in cough and less wheezing but continues to have some cough, worse at night. Clear nasal drainage. Some throat tickle, some swelling. No ear pain, feels lightheaded. Had taken some mucinex, but has stopped when he started prescriptions. Feels like he needs some rest, is working 10 hour days and has not had but 1 day off a week.   No fevers recently.  No antihistamine or nasal spray.    Past Medical History:  Diagnosis Date  . Allergy   . Asthma    Past Surgical History:  Procedure Laterality Date  . ACHILLES TENDON REPAIR     No family history on file. Social History   Tobacco Use  . Smoking status: Never Smoker  . Smokeless tobacco: Never Used  Substance Use Topics  . Alcohol use: No    Comment: occasionally  . Drug use: No     Review of Systems Per HPI    Objective:   Physical Exam  Constitutional: He is oriented to person, place, and time. He appears well-developed and well-nourished. No distress.  HENT:  Head: Normocephalic and atraumatic.  Right Ear: Tympanic membrane, external ear and ear canal normal.  Left Ear: Tympanic membrane, external ear and ear canal normal.  Nose: Mucosal edema and rhinorrhea present.  Mouth/Throat: Uvula is midline.  Mild erythema, no exudate.   Eyes: Conjunctivae are normal.  Neck: Normal range of motion. Neck supple.  Cardiovascular: Normal rate, regular rhythm and normal heart sounds.  Pulmonary/Chest: Effort normal and breath sounds normal.  Occasional dry cough.   Lymphadenopathy:    He has no cervical adenopathy.  Neurological: He is alert and oriented to person, place, and time.  Skin: Skin is warm and dry. He is not diaphoretic.  Psychiatric: He has a normal mood and  affect. His behavior is normal. Judgment and thought content normal.  Vitals reviewed.     BP 130/80 (BP Location: Right Arm, Patient Position: Sitting, Cuff Size: Large)   Pulse 71   Temp 98.5 F (36.9 C) (Oral)   Ht 6' 3"  (1.905 m)   Wt 231 lb (104.8 kg)   SpO2 98%   BMI 28.87 kg/m  Wt Readings from Last 3 Encounters:  12/30/17 231 lb (104.8 kg)  12/13/17 224 lb 8 oz (101.8 kg)  08/17/17 237 lb 12 oz (107.8 kg)       Assessment & Plan:  1. Cough - lungs are clear today, do not think he has continued bacterial infection, more likely allergic vs post viral - he prefers to hold off on additional prednisone because it makes him "feel funny."  - discussed using albuterol inhaler every 4-6 hours prn cough/wheeze, restart Mucinex to thin secretions, night time cough suppressant, add otc antihistamine for allergy symptoms/drainage, rest and hydrate - OOW note which I offered to extend past 01/01/18 if needed - he was instructed to let me know if not better in 5 days - HYDROcodone-homatropine (HYCODAN) 5-1.5 MG/5ML syrup; Take 5 mLs by mouth at bedtime as needed for cough.  Dispense: 60 mL; Refill: 0  2. Allergic rhinitis, unspecified seasonality, unspecified trigger - add OTC long acting antihistamine daily   Clarene Reamer, FNP-BC  [facility] [place]  [facility]  12/30/2017 4:56 PM

## 2017-12-30 NOTE — Patient Instructions (Addendum)
Good to see you today   Use your inhaler every 4 to 6 hours as needed for cough and wheeze  Get some rest and drink enough fluids to make your urine light yellow  If not better by Wed, please let me know

## 2018-01-05 ENCOUNTER — Telehealth: Payer: Self-pay | Admitting: Primary Care

## 2018-01-05 NOTE — Telephone Encounter (Signed)
Left message asking pt to call office  °

## 2018-01-05 NOTE — Telephone Encounter (Signed)
Pt brought in [facility] for the lung infection he had. Placed in Rx tower

## 2018-01-09 NOTE — Telephone Encounter (Signed)
Spoke with pt he is aware that the forms I received there is nothing for me to fill out.  He will check with HR to see if there is other forms for me to fill out and have them faxed to me  Pt stated he was out 9/20 for a couple of days Then again in oct and his manager told him to stay out for another week. Pt will call back with the exact dates

## 2018-01-11 NOTE — Telephone Encounter (Signed)
Left message asking pt to call office  °

## 2018-01-12 NOTE — Telephone Encounter (Signed)
Left message asking pt to call office  °

## 2018-01-16 NOTE — Telephone Encounter (Signed)
Noted, this seems appropriate.

## 2018-01-16 NOTE — Telephone Encounter (Signed)
Left message asking pt to call office  °

## 2018-01-16 NOTE — Telephone Encounter (Signed)
Forde Radon  I spoke with pt on 10/21 letting him know the forms I received had nothing for me to fill out.  He stated he would check with hr and get back in touch with me  Since 10/21 I have left 3 message asking pt to call the office.  If I don't hear anything from pt by end of day, i'm going to put paperwork in shred bin

## 2018-03-09 ENCOUNTER — Encounter: Payer: Self-pay | Admitting: Family Medicine

## 2018-03-09 ENCOUNTER — Ambulatory Visit: Payer: Self-pay | Admitting: Family Medicine

## 2018-03-09 VITALS — BP 122/74 | HR 73 | Temp 98.7°F | Ht 75.0 in | Wt 237.5 lb

## 2018-03-09 DIAGNOSIS — R51 Headache: Secondary | ICD-10-CM

## 2018-03-09 DIAGNOSIS — J069 Acute upper respiratory infection, unspecified: Secondary | ICD-10-CM

## 2018-03-09 DIAGNOSIS — R519 Headache, unspecified: Secondary | ICD-10-CM

## 2018-03-09 NOTE — Patient Instructions (Signed)
Based on your symptoms, it looks like you have a virus. And this is causing your headache  Antibiotics are not need for a viral infection but the following will help:   1. Drink plenty of fluids 2. Get lots of rest  Sinus Congestion 1) Neti Pot (Saline rinse) -- 2 times day -- if tolerated 2) Flonase (Store Brand ok) - once daily 3) Over the counter congestion medications  Headache 1) Ibuprofen up to 800 mg 2) Tylenol up to 1000 mg - Can take together if severe symptoms or alternate if symptoms not improving -- If worsening Headache or not getting better in next week, return to clinic  If you develop fevers (Temperature >100.4), chills, worsening symptoms or symptoms lasting longer than 10 days return to clinic.

## 2018-03-09 NOTE — Progress Notes (Signed)
Subjective:     Larry Huff is a 47 y.o. male presenting for Headache (x 4 days. Pain located behind right eye mostly but sometimes both eyes. Having some body aches, fatigue. No history of been diagnosed with migraines. Has not been taking anything for his symptoms.)     Headache   The current episode started in the past 7 days. The problem occurs intermittently. The pain is located in the right unilateral region. The pain does not radiate. The pain quality is not similar to prior headaches. The quality of the pain is described as sharp. The pain is moderate. Associated symptoms include muscle aches, photophobia and rhinorrhea. Pertinent negatives include no abdominal pain, coughing, dizziness, ear pain, eye pain, fever, nausea, numbness, sinus pressure or sore throat. He has tried nothing for the symptoms. His past medical history is significant for migraines in the family. There is no history of migraine headaches.   Unchanged with time.    Review of Systems  Constitutional: Positive for fatigue. Negative for chills and fever.  HENT: Positive for congestion, rhinorrhea and sinus pain. Negative for ear pain, postnasal drip, sinus pressure and sore throat.   Eyes: Positive for photophobia. Negative for pain and discharge.  Respiratory: Negative for cough and shortness of breath.   Cardiovascular: Negative for chest pain and leg swelling.  Gastrointestinal: Negative for abdominal pain, constipation, diarrhea and nausea.  Neurological: Positive for headaches. Negative for dizziness, light-headedness and numbness.     Social History   Tobacco Use  Smoking Status Never Smoker  Smokeless Tobacco Never Used        Objective:    BP Readings from Last 3 Encounters:  03/09/18 122/74  12/30/17 130/80  12/13/17 118/70   Wt Readings from Last 3 Encounters:  03/09/18 237 lb 8 oz (107.7 kg)  12/30/17 231 lb (104.8 kg)  12/13/17 224 lb 8 oz (101.8 kg)    BP 122/74   Pulse 73    Temp 98.7 F (37.1 C)   Ht 6' 3"  (1.905 m)   Wt 237 lb 8 oz (107.7 kg)   SpO2 98%   BMI 29.69 kg/m    Physical Exam Constitutional:      General: He is not in acute distress.    Appearance: He is well-developed. He is not ill-appearing.  HENT:     Head: Normocephalic and atraumatic.     Right Ear: Tympanic membrane and ear canal normal.     Left Ear: Tympanic membrane and ear canal normal.     Nose: Mucosal edema and rhinorrhea present.     Right Sinus: No maxillary sinus tenderness or frontal sinus tenderness.     Left Sinus: No maxillary sinus tenderness or frontal sinus tenderness.     Mouth/Throat:     Pharynx: Uvula midline. Posterior oropharyngeal erythema present. No oropharyngeal exudate.     Tonsils: Swelling: 0 on the right. 0 on the left.  Eyes:     General: No scleral icterus.    Extraocular Movements: Extraocular movements intact.     Pupils: Pupils are equal, round, and reactive to light. Pupils are equal.  Neck:     Musculoskeletal: Neck supple.  Cardiovascular:     Rate and Rhythm: Normal rate and regular rhythm.     Heart sounds: No murmur.  Pulmonary:     Effort: Pulmonary effort is normal. No respiratory distress.     Breath sounds: Normal breath sounds.  Lymphadenopathy:     Cervical: No cervical  adenopathy.  Skin:    General: Skin is warm and dry.     Capillary Refill: Capillary refill takes less than 2 seconds.  Neurological:     Mental Status: He is alert. Mental status is at baseline.     Cranial Nerves: No cranial nerve deficit or facial asymmetry.     Sensory: No sensory deficit.     Motor: No weakness.     Coordination: Coordination normal.     Deep Tendon Reflexes: Reflexes normal.     Reflex Scores:      Patellar reflexes are 2+ on the left side.      Achilles reflexes are 2+ on the right side and 2+ on the left side.    Comments: Unable to get patellar reflex. But otherwise reassuring neuro exam  Psychiatric:        Mood and Affect:  Mood normal.        Behavior: Behavior normal.           Assessment & Plan:   Problem List Items Addressed This Visit    None    Visit Diagnoses    Viral URI    -  Primary   Intractable headache, unspecified chronicity pattern, unspecified headache type         Patient has been working through headache with no weakness or sensation loss. Unclear why difficulty obtaining right patellar reflex but recommend treatment for viral etiology.   Symptomatic care Try tylenol or ibuprofen for HA  Return if symptoms worsen or fail to improve.  Lesleigh Noe, MD

## 2018-03-29 ENCOUNTER — Ambulatory Visit: Payer: Self-pay | Admitting: Family Medicine

## 2018-03-29 ENCOUNTER — Encounter: Payer: Self-pay | Admitting: Family Medicine

## 2018-03-29 ENCOUNTER — Ambulatory Visit: Payer: Self-pay | Admitting: Internal Medicine

## 2018-03-29 VITALS — BP 118/78 | HR 76 | Temp 98.4°F | Ht 75.0 in | Wt 237.0 lb

## 2018-03-29 DIAGNOSIS — B9789 Other viral agents as the cause of diseases classified elsewhere: Secondary | ICD-10-CM

## 2018-03-29 DIAGNOSIS — J069 Acute upper respiratory infection, unspecified: Secondary | ICD-10-CM

## 2018-03-29 NOTE — Patient Instructions (Signed)
For nasal congestion you can use Afrin nasal spray for 3 days max, Sudafed, saline nasal spray (generic is fine for all). For cough you can try Delsym or Mucinex DM Drink enough fluids to make your urine light yellow. For fever/chill/muscle aches you can take over the counter acetaminophen or ibuprofen.  Please come back in if you are not better in 5-7 days or if you develop wheezing, shortness of breath or persistent vomiting.

## 2018-03-29 NOTE — Progress Notes (Signed)
Subjective:    Patient ID: Larry Huff, male    DOB: Jul 01, 1970, 47 y.o.   MRN: [id number]  HPI This is a 48 yo male who presents today with cough, head congestion, chills and fever x 4 days. Started with chills, body aches. Non productive cough. Used albuterol inhaler for cough and chest tightness with good relief. No other meds for symptoms. Increased fatigue. Good fluid intake.   Past Medical History:  Diagnosis Date  . Allergy   . Asthma    Past Surgical History:  Procedure Laterality Date  . ACHILLES TENDON REPAIR     No family history on file. Social History   Tobacco Use  . Smoking status: Never Smoker  . Smokeless tobacco: Never Used  Substance Use Topics  . Alcohol use: Not Currently    Comment: occasionally  . Drug use: No      Review of Systems    per HPI Objective:   Physical Exam Vitals signs reviewed.  Constitutional:      General: He is not in acute distress.    Appearance: Normal appearance. He is normal weight. He is ill-appearing. He is not toxic-appearing.  HENT:     Head: Normocephalic and atraumatic.     Right Ear: Tympanic membrane, ear canal and external ear normal.     Left Ear: Tympanic membrane, ear canal and external ear normal.     Nose: Congestion present.     Mouth/Throat:     Mouth: Mucous membranes are moist.     Pharynx: Oropharynx is clear.  Eyes:     Conjunctiva/sclera: Conjunctivae normal.  Neck:     Musculoskeletal: Normal range of motion. No neck rigidity.  Cardiovascular:     Rate and Rhythm: Normal rate and regular rhythm.     Heart sounds: Normal heart sounds.  Pulmonary:     Effort: Pulmonary effort is normal.     Breath sounds: Normal breath sounds.  Lymphadenopathy:     Cervical: No cervical adenopathy.  Skin:    General: Skin is warm and dry.  Neurological:     Mental Status: He is alert and oriented to person, place, and time.  Psychiatric:        Mood and Affect: Mood normal.        Behavior: Behavior  normal.        Thought Content: Thought content normal.        Judgment: Judgment normal.       BP 118/78   Pulse 76   Temp 98.4 F (36.9 C) (Oral)   Ht 6' 3"  (1.905 m)   Wt 237 lb (107.5 kg)   SpO2 98%   BMI 29.62 kg/m  Wt Readings from Last 3 Encounters:  03/29/18 237 lb (107.5 kg)  03/09/18 237 lb 8 oz (107.7 kg)  12/30/17 231 lb (104.8 kg)       Assessment & Plan:  1. Viral URI with cough - Provided written and verbal information regarding diagnosis and treatment. - Encouraged him to treat symptoms when they occur -  Patient Instructions  For nasal congestion you can use Afrin nasal spray for 3 days max, Sudafed, saline nasal spray (generic is fine for all). For cough you can try Delsym or Mucinex DM Drink enough fluids to make your urine light yellow. For fever/chill/muscle aches you can take over the counter acetaminophen or ibuprofen.  Please come back in if you are not better in 5-7 days or if you develop wheezing, shortness  of breath or persistent vomiting.     Clarene Reamer, FNP-BC  [facility]  03/29/2018 5:22 PM

## 2018-10-24 ENCOUNTER — Telehealth: Payer: Self-pay

## 2018-10-24 ENCOUNTER — Ambulatory Visit: Payer: Self-pay | Admitting: Primary Care

## 2018-10-24 NOTE — Telephone Encounter (Signed)
[place] Night - Client Nonclinical Telephone Record AccessNurse Client [place] Night - Client Client Site [place] - Night Physician AA - PHYSICIAN, Verita Schneiders- MD Contact Type Call Who Is Calling Patient / Member / Family / Caregiver Caller Name Slaughters Phone Number [phone] Patient Name Larry Huff Patient DOB [birth year] Call Type Message Only Information Provided Reason for Call Request to [facility] Appointment Initial Comment Caller reports she would like to cancel husbands appointment for tomorrow. Additional Comment Provided Office Hours. Call Closed By: Erich Montane Transaction Date/Time: 10/23/2018 5:20:30 PM (ET)

## 2018-10-24 NOTE — Telephone Encounter (Signed)
Noted. Will cancel appointment

## 2018-10-31 ENCOUNTER — Ambulatory Visit: Payer: Self-pay | Admitting: Family Medicine

## 2018-10-31 DIAGNOSIS — Z0289 Encounter for other administrative examinations: Secondary | ICD-10-CM

## 2018-12-31 IMAGING — MR MR HEAD W/O CM
9 of 10 series · 37 of 48 positions shown · non-contrast
Comparison: CT 08/11/2017, MRI 07/06/2013

CLINICAL DATA: Left-sided numbness

EXAM:
MRI HEAD WITHOUT CONTRAST
TECHNIQUE: Multiplanar, multiecho pulse sequences of the brain and surrounding
structures were obtained without intravenous contrast.

[Series 4: DWI · axial · 3.0mm · 1.09mm/px · z∈[-36,+113]mm · 9 of 102 slices shown (1 of 4)]
[im 1/102]
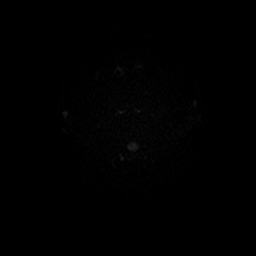
[im 13/102]
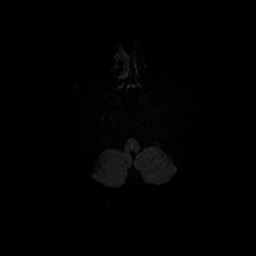
[im 26/102]
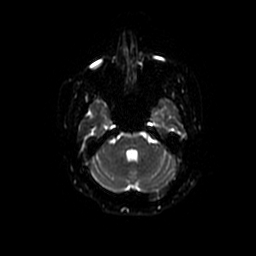
[im 38/102]
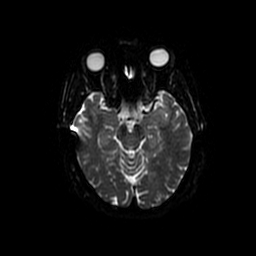
[im 51/102]
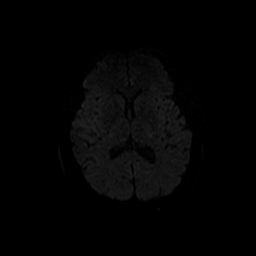
[im 64/102]
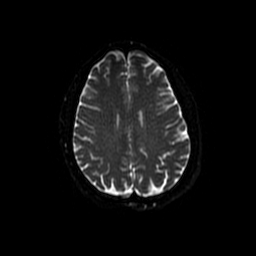
[im 76/102]
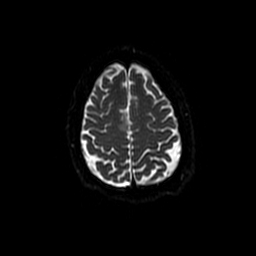
[im 89/102]
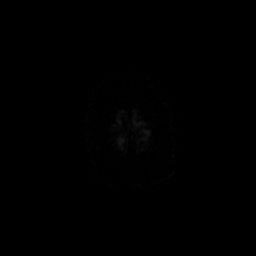
[im 102/102]
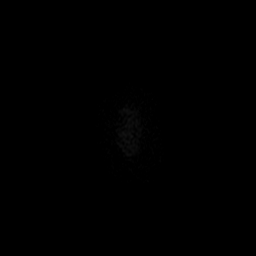

[Series 5: DWI · coronal · 5.0mm · 1.09mm/px · 7 of 74 slices shown (2 of 4)]
[im 1/74]
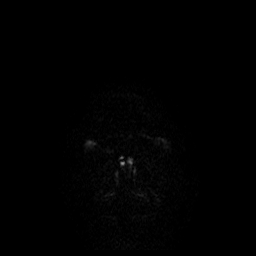
[im 13/74]
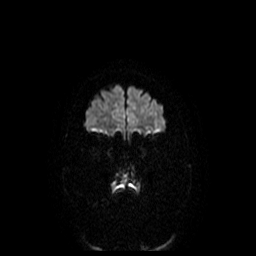
[im 25/74]
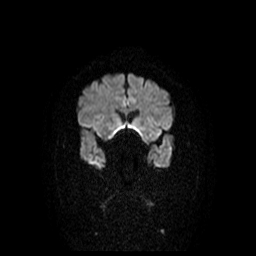
[im 37/74]
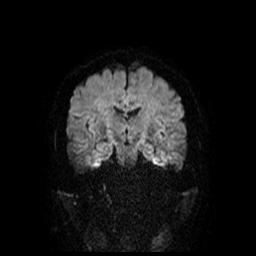
[im 49/74]
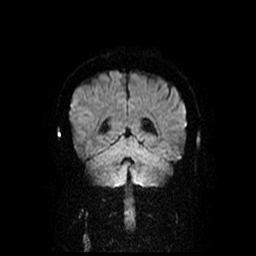
[im 61/74]
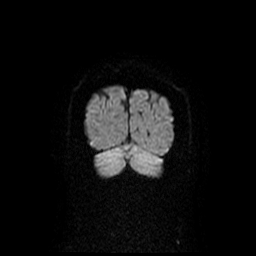
[im 74/74]
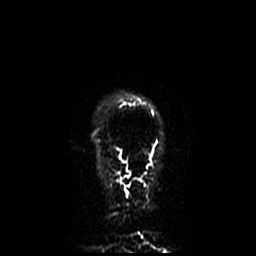

[Series 6: T1 · sagittal · 5.0mm · 0.47mm/px · 3 of 27 slices shown]
[im 1/27]
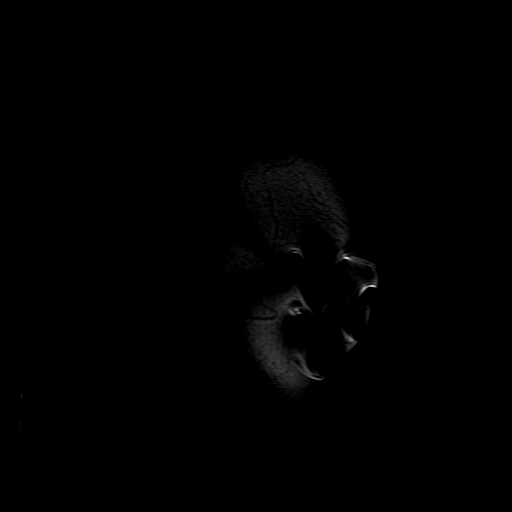
[im 14/27]
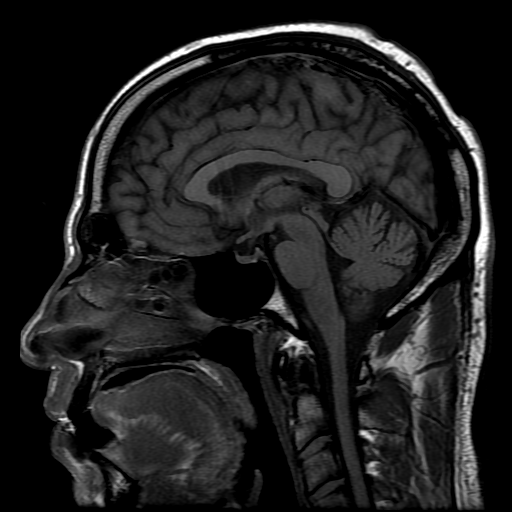
[im 27/27]
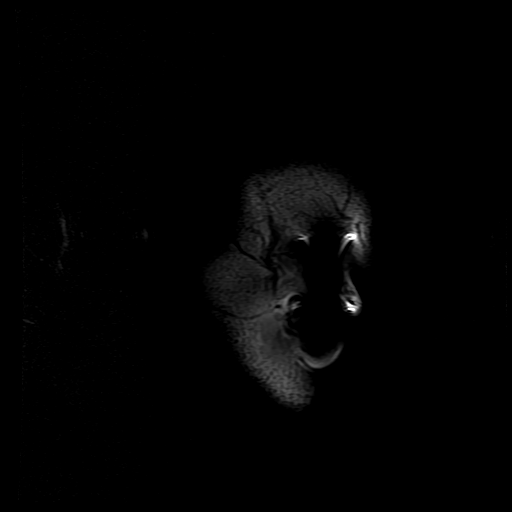

[Series 7: T2 · axial · 5.0mm · 0.43mm/px · z∈[-32,+111]mm · 2 of 25 slices shown (1 of 2)]
[im 1/25]
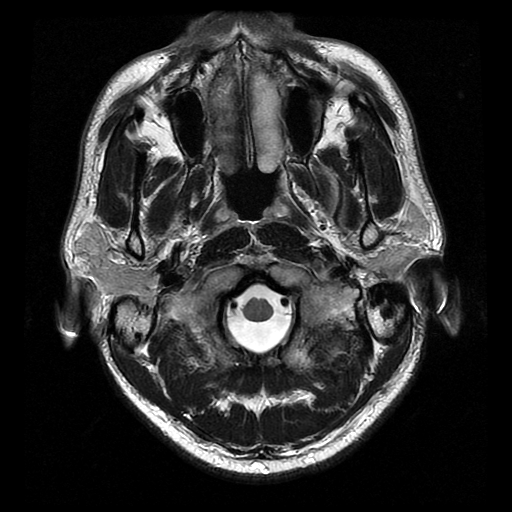
[im 25/25]
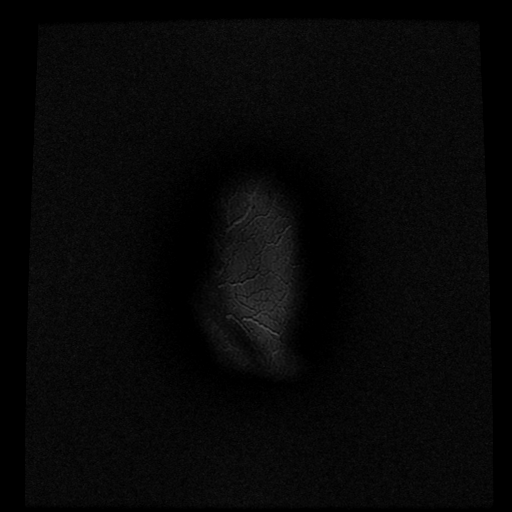

[Series 8: FLAIR · axial · 3.0mm · 0.43mm/px · z∈[-44,+104]mm · 3 of 26 slices shown]
[im 1/26]
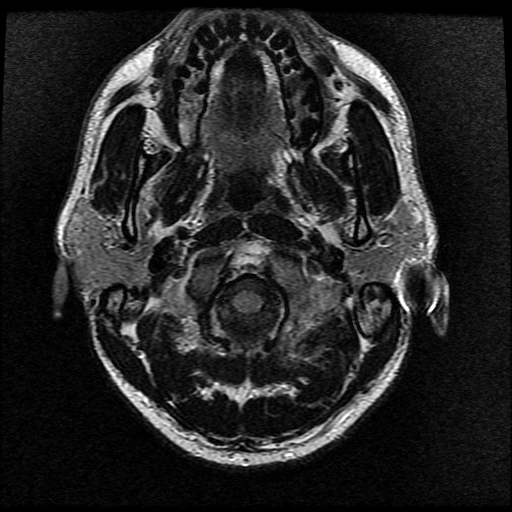
[im 13/26]
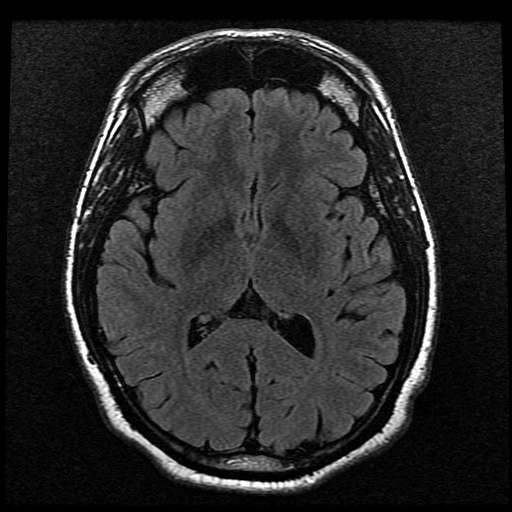
[im 26/26]
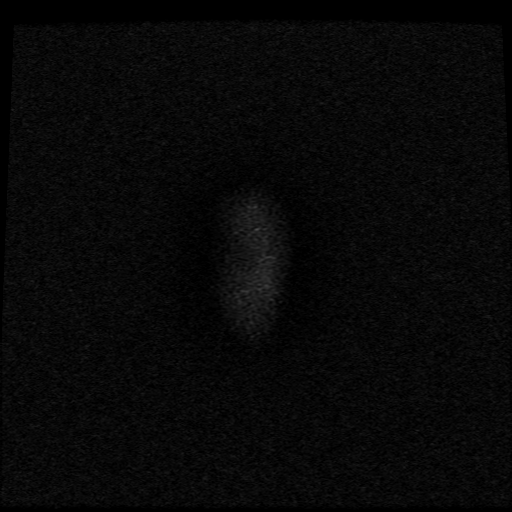

[Series 9: ax mpgr · axial · 5.0mm · 0.45mm/px · 1 of 24 slices shown]
[im 1/24]
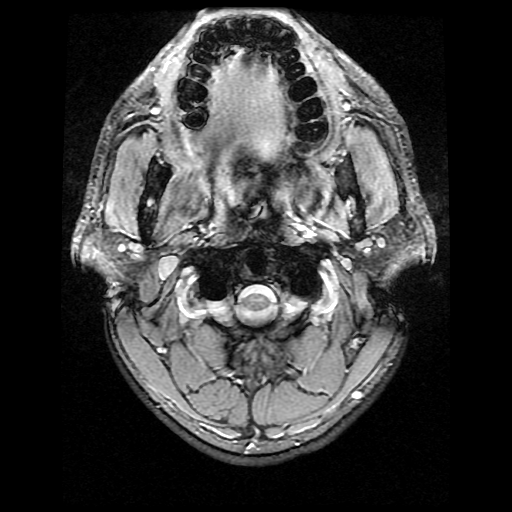

[Series 11: T2 · coronal · 5.0mm · 0.39mm/px · 3 of 29 slices shown (2 of 2)]
[im 1/29]
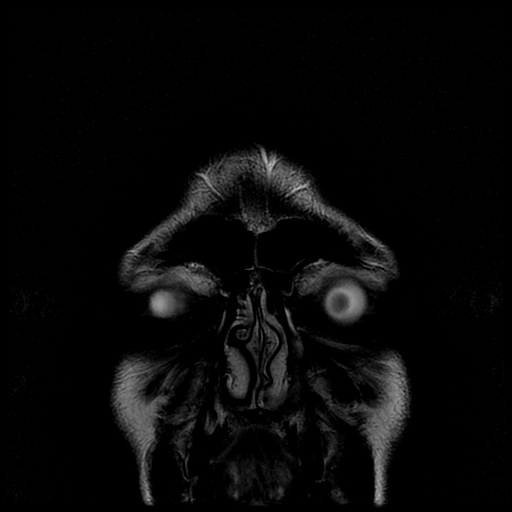
[im 15/29]
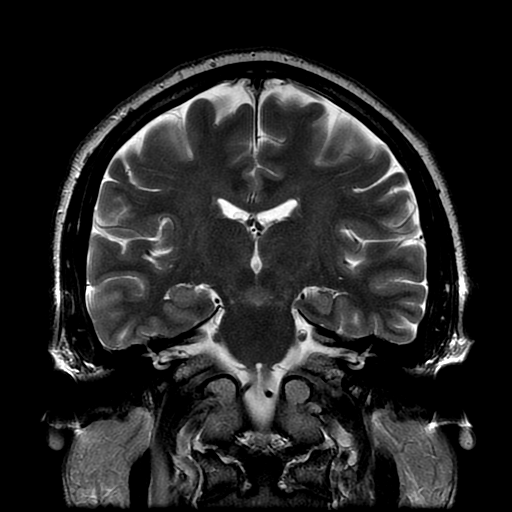
[im 29/29]
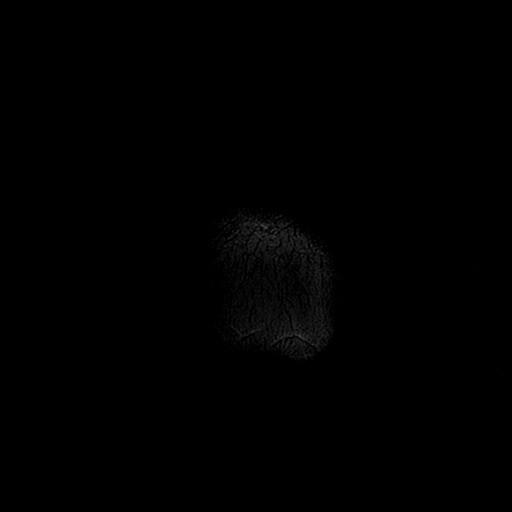

[Series 400: DWI · axial · 3.0mm · 1.09mm/px · z∈[-36,+113]mm · 5 of 51 slices shown (3 of 4)]
[im 1/51]
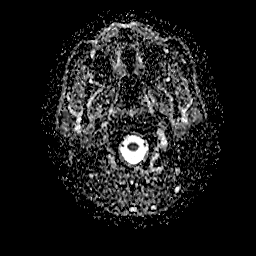
[im 13/51]
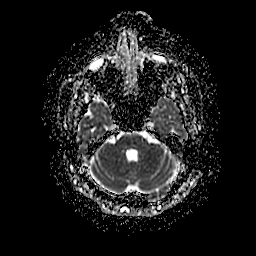
[im 26/51]
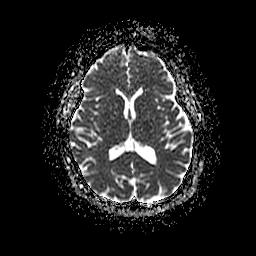
[im 38/51]
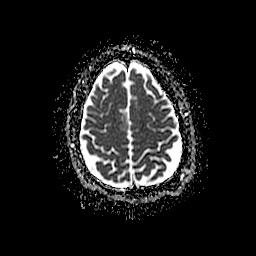
[im 51/51]
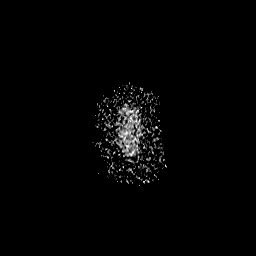

[Series 500: DWI · coronal · 5.0mm · 1.09mm/px · 4 of 37 slices shown (4 of 4)]
[im 1/37]
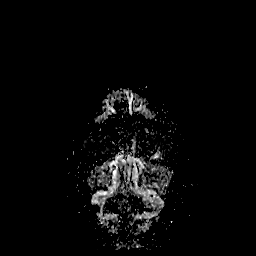
[im 13/37]
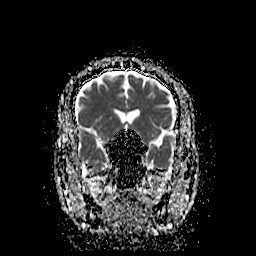
[im 25/37]
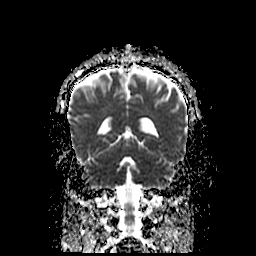
[im 37/37]
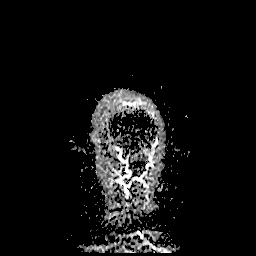

[37 of 48 positions shown; findings below may reference images not displayed]

FINDINGS: Brain: No acute infarction, hemorrhage, hydrocephalus, extra-axial
collection or mass lesion.

Vascular: Normal arterial flow void

Skull and upper cervical spine: Negative

Sinuses/Orbits: Negative

Other: None
IMPRESSION: Normal MRI head

## 2020-06-15 NOTE — Progress Notes (Deleted)
    Mishaal Lansdale T. Vicki Chaffin, MD, [place]  [facility] and [place] at [facility] [place], [address]  Phone: [phone]  FAX: [phone]  SAL SPRATLEY - 50 y.o. male  MRN [id number]  Date of Birth: 1971/02/24  Date: 06/16/2020  PCP: Pleas Koch, NP  Referral: Pleas Koch, NP  No chief complaint on file.   This visit occurred during the SARS-CoV-2 public health emergency.  Safety protocols were in place, including screening questions prior to the visit, additional usage of staff PPE, and extensive cleaning of exam room while observing appropriate contact time as indicated for disinfecting solutions.   Subjective:   ZEBASTIAN CARICO is a 50 y.o. very pleasant male patient with There is no height or weight on file to calculate BMI. who presents with the following:  He is here to review a skin lesion.    Review of Systems is noted in the HPI, as appropriate  Objective:   There were no vitals taken for this visit.  GEN: No acute distress; alert,appropriate. PULM: Breathing comfortably in no respiratory distress PSYCH: Normally interactive.   Laboratory and Imaging Data:  Assessment and Plan:   ***

## 2020-06-16 ENCOUNTER — Ambulatory Visit: Payer: Self-pay | Admitting: Family Medicine

## 2020-06-16 DIAGNOSIS — Z0289 Encounter for other administrative examinations: Secondary | ICD-10-CM

## 2020-10-24 ENCOUNTER — Emergency Department (HOSPITAL_COMMUNITY)
Admission: EM | Admit: 2020-10-24 | Discharge: 2020-10-24 | Disposition: A | Discharge status: Home / Self Care | Payer: Self-pay | Attending: Emergency Medicine | Admitting: Emergency Medicine

## 2020-10-24 ENCOUNTER — Encounter (HOSPITAL_COMMUNITY): Payer: Self-pay | Admitting: Emergency Medicine

## 2020-10-24 ENCOUNTER — Emergency Department (HOSPITAL_COMMUNITY)
Admission: EM | Admit: 2020-10-24 | Discharge: 2020-10-24 | Disposition: A | Discharge status: Home / Self Care | Payer: Self-pay | Source: Home / Self Care | Attending: Emergency Medicine | Admitting: Emergency Medicine

## 2020-10-24 ENCOUNTER — Other Ambulatory Visit: Payer: Self-pay

## 2020-10-24 DIAGNOSIS — S0501XA Injury of conjunctiva and corneal abrasion without foreign body, right eye, initial encounter: Secondary | ICD-10-CM | POA: Insufficient documentation

## 2020-10-24 DIAGNOSIS — J45909 Unspecified asthma, uncomplicated: Secondary | ICD-10-CM | POA: Insufficient documentation

## 2020-10-24 DIAGNOSIS — Z5321 Procedure and treatment not carried out due to patient leaving prior to being seen by health care provider: Secondary | ICD-10-CM | POA: Insufficient documentation

## 2020-10-24 DIAGNOSIS — W228XXA Striking against or struck by other objects, initial encounter: Secondary | ICD-10-CM | POA: Insufficient documentation

## 2020-10-24 DIAGNOSIS — Y990 Civilian activity done for income or pay: Secondary | ICD-10-CM | POA: Insufficient documentation

## 2020-10-24 DIAGNOSIS — Y9389 Activity, other specified: Secondary | ICD-10-CM | POA: Insufficient documentation

## 2020-10-24 DIAGNOSIS — H5789 Other specified disorders of eye and adnexa: Secondary | ICD-10-CM | POA: Insufficient documentation

## 2020-10-24 MED ORDER — TETRACAINE HCL 0.5 % OP SOLN
2.0000 [drp] | Freq: Once | OPHTHALMIC | Status: AC
  Administered 2020-10-24: 2 [drp] via OPHTHALMIC
  Filled 2020-10-24: qty 4

## 2020-10-24 MED ORDER — FLUORESCEIN SODIUM 1 MG OP STRP
1.0000 | ORAL_STRIP | Freq: Once | OPHTHALMIC | Status: AC
  Administered 2020-10-24: 1 via OPHTHALMIC
  Filled 2020-10-24: qty 1

## 2020-10-24 MED ORDER — SULFACETAMIDE SODIUM 10 % OP SOLN: 1.0000 [drp] | OPHTHALMIC | 0 refills | Status: AC

## 2020-10-24 NOTE — ED Triage Notes (Signed)
Pt reports moving boxes at work and getting debrie in right eye ~3-4 hours ago. Eye is red upon assessment. Changes in vision initially, but has since subsided.

## 2020-10-24 NOTE — ED Notes (Signed)
Patient verbalizes understanding of discharge instructions. Opportunity for questioning and answers were provided. Pt discharged from ED. 

## 2020-10-24 NOTE — ED Triage Notes (Signed)
Patient reports right eye irritation from dust/debris while moving boxes at work this evening , no vision loss , mild redness and teary OD .

## 2020-10-24 NOTE — ED Provider Notes (Signed)
[facility] EMERGENCY DEPARTMENT Provider Note   CSN: [id number] Arrival date & time: 10/24/20  6270     History Chief Complaint  Patient presents with   Eye Irritation     Larry Huff is a 50 y.o. male.  Patient is a 50 year old male who presents with irritation to his right eye.  This occurred at work.  He was moving some boxes and got some dust in his right eye.  This happened about 5 to 6 hours ago.  He initially had some blurry vision but says it is now back to normal.  He says the irritation has improved.  He did wash his eye out with water.  He denies any foreign body sensation currently.      Past Medical History:  Diagnosis Date   Allergy    Asthma     Patient Active Problem List   Diagnosis Date Noted   Seasonal allergies 05/14/2016   Preventative health care 03/12/2016   Asthma 03/12/2016   Lactose intolerance 03/14/2015   Obesity, Class I, BMI 30-34.9 03/14/2015    Past Surgical History:  Procedure Laterality Date   ACHILLES TENDON REPAIR         No family history on file.  Social History   Tobacco Use   Smoking status: Never   Smokeless tobacco: Never  Substance Use Topics   Alcohol use: Not Currently    Comment: occasionally   Drug use: No    Home Medications Prior to Admission medications   Medication Sig Start Date End Date Taking? Authorizing Provider  acetaminophen (TYLENOL) 325 MG tablet Take 650 mg by mouth every 6 (six) hours as needed.   Yes [provider]  albuterol (PROVENTIL HFA;VENTOLIN HFA) 108 (90 Base) MCG/ACT inhaler Inhale 2 puffs into the lungs every 6 (six) hours as needed for wheezing or shortness of breath. 12/13/17  Yes Bedsole, Amy E, MD  Multiple Vitamin (MULTIVITAMIN PO) Take 1 tablet by mouth daily.   Yes [provider]  sulfacetamide (BLEPH-10) 10 % ophthalmic solution Place 1-2 drops into the right eye every 4 (four) hours for 7 days. 10/24/20 10/31/20 Yes Malvin Johns, MD   Vitamin D, Cholecalciferol, 10 MCG (400 UNIT) CAPS Take 1 tablet by mouth.   Yes [provider]  aspirin EC 81 MG tablet Take 1 tablet (81 mg total) by mouth daily. Patient not taking: Reported on 10/24/2020 07/06/13   Donne Hazel, MD    Allergies    Eggs or egg-derived products, Erythromycin, and Milk-related compounds  Review of Systems   Review of Systems  Constitutional:  Negative for fever.  Eyes:  Positive for pain, discharge (Watery), redness and visual disturbance. Negative for photophobia and itching.  Respiratory:  Negative for shortness of breath.   Gastrointestinal:  Negative for nausea and vomiting.  Skin:  Negative for wound.  Neurological:  Negative for headaches.   Physical Exam Updated Vital Signs BP (!) 151/90   Pulse 76   Temp 98.6 F (37 C) (Oral)   Resp 16   Ht 6' 3"  (1.905 m)   Wt 122 kg   SpO2 100%   BMI 33.62 kg/m   Physical Exam HENT:     Head: Normocephalic and atraumatic.     Nose: No congestion.     Mouth/Throat:     Mouth: Mucous membranes are moist.  Eyes:     General:        Right eye: No foreign body.  Extraocular Movements: Extraocular movements intact.     Pupils: Pupils are equal, round, and reactive to light.     Comments: Minimal injection to the conjunctive of the right eye, no foreign bodies visualized, no significant discharge currently Tetracaine drops were placed and fluorescein was used.  There was a small area of staining in the right lower cornea at the 7 o'clock position.  Visual acuity was noted to be 20/40 in the left eye and 20/30 2 in the right eye.  Of note patient does wear glasses which she did not have with him.  He does not wear contacts.  Cardiovascular:     Rate and Rhythm: Normal rate.  Pulmonary:     Effort: Pulmonary effort is normal.  Musculoskeletal:        General: Normal range of motion.  Skin:    General: Skin is warm and dry.  Neurological:     General: No focal deficit present.      Mental Status: He is alert.    ED Results / Procedures / Treatments   Labs (all labs ordered are listed, but only abnormal results are displayed) Labs Reviewed - No data to display  EKG None  Radiology No results found.  Procedures Procedures   Medications Ordered in ED Medications  fluorescein ophthalmic strip 1 strip (has no administration in time range)  tetracaine (PONTOCAINE) 0.5 % ophthalmic solution 2 drop (has no administration in time range)    ED Course  I have reviewed the triage vital signs and the nursing notes.  Pertinent labs & imaging results that were available during my care of the patient were reviewed by me and considered in my medical decision making (see chart for details).    MDM Rules/Calculators/A&P                           Patient with small corneal abrasion.  No foreign body is visualized.  No vision loss is noted.  He does not wear contacts.  He was discharged home in good condition.  He was started on antibiotic eyedrops.  He was given a referral to follow-up with ophthalmology if his symptoms have not improved within the next 1 to 2 days.  Return precautions were given. Final Clinical Impression(s) / ED Diagnoses Final diagnoses:  Abrasion of right cornea, initial encounter    Rx / DC Orders ED Discharge Orders          Ordered    sulfacetamide (BLEPH-10) 10 % ophthalmic solution  Every 4 hours        10/24/20 0928             Malvin Johns, MD 10/24/20 [phone]

## 2021-03-22 HISTORY — PX: TOOTH EXTRACTION: SUR596

## 2021-06-11 LAB — COLOGUARD

## 2021-06-11 LAB — EXTERNAL GENERIC LAB PROCEDURE

## 2021-08-04 LAB — COLOGUARD: COLOGUARD: POSITIVE — AB

## 2021-08-04 LAB — EXTERNAL GENERIC LAB PROCEDURE: COLOGUARD: POSITIVE — AB

## 2022-09-21 ENCOUNTER — Ambulatory Visit: Payer: BC Managed Care – PPO | Admitting: Family

## 2022-10-20 ENCOUNTER — Ambulatory Visit: Payer: BC Managed Care – PPO | Admitting: Family

## 2023-07-04 ENCOUNTER — Other Ambulatory Visit: Payer: Self-pay

## 2023-07-04 ENCOUNTER — Emergency Department (HOSPITAL_COMMUNITY)
Admission: EM | Admit: 2023-07-04 | Discharge: 2023-07-04 | Discharge status: Against Medical Advice | Attending: Emergency Medicine | Admitting: Emergency Medicine

## 2023-07-04 DIAGNOSIS — R42 Dizziness and giddiness: Secondary | ICD-10-CM | POA: Insufficient documentation

## 2023-07-04 DIAGNOSIS — Z5321 Procedure and treatment not carried out due to patient leaving prior to being seen by health care provider: Secondary | ICD-10-CM | POA: Insufficient documentation

## 2023-07-04 LAB — COMPREHENSIVE METABOLIC PANEL WITH GFR
ALT: 27 U/L (ref 0–44)
AST: 51 U/L — ABNORMAL HIGH (ref 15–41)
Albumin: 4.7 g/dL (ref 3.5–5.0)
Alkaline Phosphatase: 76 U/L (ref 38–126)
Anion gap: 12 (ref 5–15)
BUN: 16 mg/dL (ref 6–20)
CO2: 24 mmol/L (ref 22–32)
Calcium: 9.5 mg/dL (ref 8.9–10.3)
Chloride: 101 mmol/L (ref 98–111)
Creatinine, Ser: 1.48 mg/dL — ABNORMAL HIGH (ref 0.61–1.24)
GFR, Estimated: 57 mL/min — ABNORMAL LOW (ref >60)
Glucose, Bld: 211 mg/dL — ABNORMAL HIGH (ref 70–99)
Potassium: 3.3 mmol/L — ABNORMAL LOW (ref 3.5–5.1)
Sodium: 137 mmol/L (ref 135–145)
Total Bilirubin: 1 mg/dL (ref 0.0–1.2)
Total Protein: 7.9 g/dL (ref 6.5–8.1)

## 2023-07-04 LAB — CBC WITH DIFFERENTIAL/PLATELET
Abs Immature Granulocytes: 0.01 10*3/uL (ref 0.00–0.07)
Basophils Absolute: 0 10*3/uL (ref 0.0–0.1)
Basophils Relative: 0 %
Eosinophils Absolute: 0.1 10*3/uL (ref 0.0–0.5)
Eosinophils Relative: 3 %
HCT: 46.4 % (ref 39.0–52.0)
Hemoglobin: 15.3 g/dL (ref 13.0–17.0)
Immature Granulocytes: 0 %
Lymphocytes Relative: 42 %
Lymphs Abs: 2 10*3/uL (ref 0.7–4.0)
MCH: 30.4 pg (ref 26.0–34.0)
MCHC: 33 g/dL (ref 30.0–36.0)
MCV: 92.2 fL (ref 80.0–100.0)
Monocytes Absolute: 0.5 10*3/uL (ref 0.1–1.0)
Monocytes Relative: 10 %
Neutro Abs: 2.1 10*3/uL (ref 1.7–7.7)
Neutrophils Relative %: 45 %
Platelets: 268 10*3/uL (ref 150–400)
RBC: 5.03 MIL/uL (ref 4.22–5.81)
RDW: 13.5 % (ref 11.5–15.5)
WBC: 4.7 10*3/uL (ref 4.0–10.5)
nRBC: 0 % (ref 0.0–0.2)

## 2023-07-04 LAB — URINALYSIS, ROUTINE W REFLEX MICROSCOPIC
Bacteria, UA: NONE SEEN
Bilirubin Urine: NEGATIVE
Glucose, UA: 150 mg/dL — AB
Hgb urine dipstick: NEGATIVE
Ketones, ur: 5 mg/dL — AB
Leukocytes,Ua: NEGATIVE
Nitrite: NEGATIVE
Protein, ur: 30 mg/dL — AB
Specific Gravity, Urine: 1.031 — ABNORMAL HIGH (ref 1.005–1.030)
pH: 7 (ref 5.0–8.0)

## 2023-07-04 NOTE — ED Notes (Signed)
 Pt requesting to leave AMA d/t wait time, discussed risks of leaving/benefits of staying, pt states he cannot wait any longer

## 2023-07-04 NOTE — ED Triage Notes (Signed)
 Pt c/o dizziness (room closing in) since 1400. Pt stated short transient period where L hand tingled, but that has since resolved.  No other focal deficits noted.

## 2023-07-04 NOTE — ED Provider Triage Note (Signed)
 Emergency Medicine Provider Triage Evaluation Note  Larry Huff , a 53 y.o. male  was evaluated in triage.  Pt complains of lightheadedness.  Patient reports feeling lightheaded for the past hour.  Denies any associated vision changes, weakness, confusion, or slurred speech.  No recent head injuries.  No blood thinner use.  Review of Systems  Positive: Lightheadedness Negative: Weakness, vision changes, slurred speech  Physical Exam  BP (!) 169/83 (BP Location: Left Arm)   Pulse 92   Temp 98.7 F (37.1 C) (Oral)   Resp 16   Ht 6\' 3"  (1.905 m)   Wt 122 kg   SpO2 100%   BMI 33.62 kg/m  Gen:   Awake, no distress   Resp:  Normal effort  MSK:   Moves extremities without difficulty  Other:  No focal neurologic deficits.  Medical Decision Making  Medically screening exam initiated at 3:14 PM.  Appropriate orders placed.  Larry Huff was informed that the remainder of the evaluation will be completed by another provider, this initial triage assessment does not replace that evaluation, and the importance of remaining in the ED until their evaluation is complete.     Larry Dusky, PA-C 07/04/23 1515

## 2023-08-24 ENCOUNTER — Ambulatory Visit: Admitting: Family Medicine

## 2023-08-24 ENCOUNTER — Ambulatory Visit: Admitting: Internal Medicine

## 2023-09-27 ENCOUNTER — Ambulatory Visit (INDEPENDENT_AMBULATORY_CARE_PROVIDER_SITE_OTHER): Admitting: Family Medicine

## 2023-09-27 ENCOUNTER — Encounter: Payer: Self-pay | Admitting: Family Medicine

## 2023-09-27 VITALS — BP 134/86 | HR 60 | Temp 98.5°F | Ht 75.0 in | Wt 238.0 lb

## 2023-09-27 DIAGNOSIS — Z23 Encounter for immunization: Secondary | ICD-10-CM | POA: Diagnosis not present

## 2023-09-27 DIAGNOSIS — E663 Overweight: Secondary | ICD-10-CM | POA: Diagnosis not present

## 2023-09-27 DIAGNOSIS — Z7689 Persons encountering health services in other specified circumstances: Secondary | ICD-10-CM

## 2023-09-27 DIAGNOSIS — R195 Other fecal abnormalities: Secondary | ICD-10-CM

## 2023-09-27 DIAGNOSIS — Z125 Encounter for screening for malignant neoplasm of prostate: Secondary | ICD-10-CM | POA: Diagnosis not present

## 2023-09-27 DIAGNOSIS — Z0000 Encounter for general adult medical examination without abnormal findings: Secondary | ICD-10-CM

## 2023-09-27 DIAGNOSIS — Z1159 Encounter for screening for other viral diseases: Secondary | ICD-10-CM | POA: Diagnosis not present

## 2023-09-27 DIAGNOSIS — Z8679 Personal history of other diseases of the circulatory system: Secondary | ICD-10-CM

## 2023-09-27 DIAGNOSIS — R9431 Abnormal electrocardiogram [ECG] [EKG]: Secondary | ICD-10-CM

## 2023-09-27 DIAGNOSIS — Z114 Encounter for screening for human immunodeficiency virus [HIV]: Secondary | ICD-10-CM

## 2023-09-27 LAB — CBC WITH DIFFERENTIAL/PLATELET
Basophils Absolute: 0 K/uL (ref 0.0–0.1)
Basophils Relative: 0.5 % (ref 0.0–3.0)
Eosinophils Absolute: 0.1 K/uL (ref 0.0–0.7)
Eosinophils Relative: 2.2 % (ref 0.0–5.0)
HCT: 46.9 % (ref 39.0–52.0)
Hemoglobin: 15.6 g/dL (ref 13.0–17.0)
Lymphocytes Relative: 43.3 % (ref 12.0–46.0)
Lymphs Abs: 1.7 K/uL (ref 0.7–4.0)
MCHC: 33.2 g/dL (ref 30.0–36.0)
MCV: 91 fl (ref 78.0–100.0)
Monocytes Absolute: 0.4 K/uL (ref 0.1–1.0)
Monocytes Relative: 9.4 % (ref 3.0–12.0)
Neutro Abs: 1.8 K/uL (ref 1.4–7.7)
Neutrophils Relative %: 44.6 % (ref 43.0–77.0)
Platelets: 259 K/uL (ref 150.0–400.0)
RBC: 5.16 Mil/uL (ref 4.22–5.81)
RDW: 14.7 % (ref 11.5–15.5)
WBC: 4 K/uL (ref 4.0–10.5)

## 2023-09-27 LAB — TSH: TSH: 1.86 u[IU]/mL (ref 0.35–5.50)

## 2023-09-27 LAB — COMPREHENSIVE METABOLIC PANEL WITH GFR
ALT: 22 U/L (ref 0–53)
AST: 22 U/L (ref 0–37)
Albumin: 4.7 g/dL (ref 3.5–5.2)
Alkaline Phosphatase: 78 U/L (ref 39–117)
BUN: 15 mg/dL (ref 6–23)
CO2: 30 meq/L (ref 19–32)
Calcium: 9.9 mg/dL (ref 8.4–10.5)
Chloride: 103 meq/L (ref 96–112)
Creatinine, Ser: 1.36 mg/dL (ref 0.40–1.50)
GFR: 60.29 mL/min (ref >60.00)
Glucose, Bld: 100 mg/dL — ABNORMAL HIGH (ref 70–99)
Potassium: 5.9 meq/L — ABNORMAL HIGH (ref 3.5–5.1)
Sodium: 140 meq/L (ref 135–145)
Total Bilirubin: 0.5 mg/dL (ref 0.2–1.2)
Total Protein: 7.8 g/dL (ref 6.0–8.3)

## 2023-09-27 LAB — LIPID PANEL
Cholesterol: 205 mg/dL — ABNORMAL HIGH (ref 0–200)
HDL: 46.3 mg/dL (ref >39.00)
LDL Cholesterol: 143 mg/dL — ABNORMAL HIGH (ref 0–99)
NonHDL: 159.03
Total CHOL/HDL Ratio: 4
Triglycerides: 81 mg/dL (ref 0.0–149.0)
VLDL: 16.2 mg/dL (ref 0.0–40.0)

## 2023-09-27 LAB — PSA: PSA: 0.96 ng/mL (ref 0.10–4.00)

## 2023-09-27 LAB — HEMOGLOBIN A1C: Hgb A1c MFr Bld: 6 % (ref 4.6–6.5)

## 2023-09-27 NOTE — Progress Notes (Signed)
 New Patient Office Visit  Subjective   Patient ID: Larry Huff, male    DOB: Dec 11, 1970  Age: 53 y.o. MRN: [id number]  CC:  Chief Complaint  Patient presents with   Establish Care    HPI Larry Huff presents to establish care with new provider.  Patients previous primary care provider: [name]. Last seen in 2020.   Specialist: Specialist  No concerns.  Diet: General diet  Exercise: 2 times a week-martial art Eye exam: Not within the last year  Dental Care: Receives dental, since last year    Outpatient Encounter Medications as of 09/27/2023  Medication Sig   acetaminophen (TYLENOL) 325 MG tablet Take 650 mg by mouth every 6 (six) hours as needed.   aspirin  EC 81 MG tablet Take 1 tablet (81 mg total) by mouth daily.   [DISCONTINUED] Multiple Vitamin (MULTIVITAMIN PO) Take 1 tablet by mouth daily.   [DISCONTINUED] albuterol  (PROVENTIL  HFA;VENTOLIN  HFA) 108 (90 Base) MCG/ACT inhaler Inhale 2 puffs into the lungs every 6 (six) hours as needed for wheezing or shortness of breath. (Patient not taking: Reported on 09/27/2023)   [DISCONTINUED] Vitamin D, Cholecalciferol, 10 MCG (400 UNIT) CAPS Take 1 tablet by mouth. (Patient not taking: Reported on 09/27/2023)   No facility-administered encounter medications on file as of 09/27/2023.    Past Medical History:  Diagnosis Date   Allergy    Arrhythmia    Asthma    DVT (deep venous thrombosis) (HCC)     Past Surgical History:  Procedure Laterality Date   ACHILLES TENDON REPAIR      Family History  Problem Relation Age of Onset   Asthma Mother    Heart murmur Mother     Social History   Socioeconomic History   Marital status: Married    Spouse name: Not on file   Number of children: 4   Years of education: Not on file   Highest education level: Some college, no degree  Occupational History   Occupation: Mental Health  Tobacco Use   Smoking status: Never   Smokeless tobacco:  Never  Vaping Use   Vaping status: Never Used  Substance and Sexual Activity   Alcohol use: Yes    Comment: 1-2 times a 6 months   Drug use: No   Sexual activity: Yes    Partners: Female  Other Topics Concern   Not on file  Social History Narrative   Wife - Larry Huff   4 Children - 3 daughters and a son   Exercise - 2-3 x week - martial arts   Enjoys reading, Administrator, sports, martial arts.   Social Drivers of Corporate investment banker Strain: Low Risk  (08/24/2023)   Overall Financial Resource Strain (CARDIA)    Difficulty of Paying Living Expenses: Not hard at all  Food Insecurity: No Food Insecurity (08/24/2023)   Hunger Vital Sign    Worried About Running Out of Food in the Last Year: Never true    Ran Out of Food in the Last Year: Never true  Transportation Needs: No Transportation Needs (08/24/2023)   PRAPARE - Administrator, Civil Service (Medical): No    Lack of Transportation (Non-Medical): No  Physical Activity: Sufficiently Active (08/24/2023)   Exercise Vital Sign    Days of Exercise per Week: 5 days    Minutes of Exercise per Session: 40 min  Stress: No Stress Concern Present (08/24/2023)   [name] of Occupational  Health - Occupational Stress Questionnaire    Feeling of Stress : Not at all  Social Connections: Socially Integrated (08/24/2023)   Social Connection and Isolation Panel    Frequency of Communication with Friends and Family: More than three times a week    Frequency of Social Gatherings with Friends and Family: More than three times a week    Attends Religious Services: More than 4 times per year    Active Member of [employer] or Organizations: Yes    Attends Engineer, structural: More than 4 times per year    Marital Status: Married  Catering manager Violence: Not At Risk (09/27/2023)   Humiliation, Afraid, Rape, and Kick questionnaire    Fear of Current or Ex-Partner: No    Emotionally Abused: No    Physically Abused: No    Sexually  Abused: No    ROS See HPI above    Objective  BP 134/86   Pulse 60   Temp 98.5 F (36.9 C) (Oral)   Ht 6' 3 (1.905 m)   Wt 238 lb (108 kg)   SpO2 98%   BMI 29.75 kg/m   Physical Exam Vitals reviewed.  Constitutional:      General: He is not in acute distress.    Appearance: Normal appearance. He is overweight. He is not ill-appearing, toxic-appearing or diaphoretic.  HENT:     Head: Normocephalic and atraumatic.     Right Ear: Tympanic membrane, ear canal and external ear normal. There is no impacted cerumen.     Left Ear: Tympanic membrane, ear canal and external ear normal. There is no impacted cerumen.     Nose:     Right Sinus: No maxillary sinus tenderness or frontal sinus tenderness.     Left Sinus: No maxillary sinus tenderness or frontal sinus tenderness.     Mouth/Throat:     Mouth: Mucous membranes are moist.     Pharynx: Oropharynx is clear. Uvula midline. No pharyngeal swelling, oropharyngeal exudate, posterior oropharyngeal erythema, uvula swelling or postnasal drip.  Eyes:     General:        Right eye: No discharge.        Left eye: No discharge.     Conjunctiva/sclera: Conjunctivae normal.     Pupils: Pupils are equal, round, and reactive to light.  Neck:     Thyroid: No thyromegaly.  Cardiovascular:     Rate and Rhythm: Normal rate and regular rhythm.     Pulses:          Posterior tibial pulses are 2+ on the right side and 2+ on the left side.     Heart sounds: Normal heart sounds. No murmur heard.    No friction rub. No gallop.  Pulmonary:     Effort: Pulmonary effort is normal. No respiratory distress.     Breath sounds: Normal breath sounds.  Abdominal:     General: Abdomen is flat. Bowel sounds are normal. There is no distension.     Palpations: Abdomen is soft. There is no mass.     Tenderness: There is no abdominal tenderness.  Musculoskeletal:        General: Normal range of motion.     Cervical back: Normal range of motion.     Right  lower leg: No edema.     Left lower leg: No edema.  Skin:    General: Skin is warm and dry.  Neurological:     General: No focal deficit present.  Mental Status: He is alert and oriented to person, place, and time. Mental status is at baseline.     Motor: No weakness.     Gait: Gait normal.  Psychiatric:        Mood and Affect: Mood normal.        Behavior: Behavior normal.        Thought Content: Thought content normal.        Judgment: Judgment normal.   EKG completed due to history of cardiac arrhythmia. Sinus brady with no ST elevation, but abnormal with possible bundle branch. A slightly change in EKG from 04/14, but nothing acute.    Assessment & Plan:  Annual physical exam  Positive colorectal cancer screening using Cologuard test -     Ambulatory referral to Gastroenterology  Overweight -     CBC with Differential/Platelet -     Comprehensive metabolic panel with GFR -     Hemoglobin A1c -     Lipid panel -     TSH  Need for hepatitis C screening test -     Hepatitis C antibody  Encounter for screening for HIV -     HIV Antibody (routine testing w rflx)  Prostate cancer screening -     PSA  History of cardiac arrhythmia -     EKG 12-Lead  Need for diphtheria-tetanus-pertussis (Tdap) vaccine -     Tdap vaccine greater than or equal to 7yo IM  Abnormal EKG -     Ambulatory referral to Cardiology -     ECHOCARDIOGRAM COMPLETE; Future  Encounter to establish care   1.Review health maintenance:  -Cologuard: Positive on 07/22/2021, referral to GI  -Covid booster: Declines  -Tdap vaccine: Ordered and administered -Hep B vaccine: Years ago -Hep C and HIV screening: Ordered  -PNA/ Zoster vaccine: Will hold on them  2.Physical exam completed. No concerns based on assessment.  3.EKG completed due to history of cardiac arrhythmia. Abnormalities found. Ordered an echocardiogram and placed a referral to cardiology. Patient reports he received care back in 20's  in [place] for his cardiac arrhythmia. He does not recall what care was provided.  4. Ordered labs (CBC, CMP, TSH, Lipid-fasting, PSA, and A1c) based on BMI-overweight and screening.   Return in about 1 year (around 09/26/2024) for physical.   Charly Holcomb, NP

## 2023-09-27 NOTE — Patient Instructions (Addendum)
-  It was nice to meet you and look forward to taking care of you. -Physical exam completed. No concerns based on assessment.  -EKG completed due to history of cardiac arrhythmia. Abnormalities found. Ordered an echocardiogram (ultrasound of heart) and placed a referral to cardiology. Please call the office or send a MyChart message if you do not receive a phone call or a MyChart message about appointment in 2 weeks.  -Placed a referral to GI for positive cologuard in 2023. Please call the office or send a MyChart message if you do not receive a phone call or a MyChart message about appointment in 2 weeks.  -Ordered labs. Office will call with lab results and will be available on MyChart.  -Tdap vaccine provided.  -Follow up in 1 year for a physical.

## 2023-09-28 ENCOUNTER — Ambulatory Visit: Payer: Self-pay | Admitting: Family Medicine

## 2023-09-28 ENCOUNTER — Telehealth: Payer: Self-pay

## 2023-09-28 DIAGNOSIS — E875 Hyperkalemia: Secondary | ICD-10-CM

## 2023-09-28 LAB — HEPATITIS C ANTIBODY: Hepatitis C Ab: NONREACTIVE

## 2023-09-28 LAB — HIV ANTIBODY (ROUTINE TESTING W REFLEX): HIV 1&2 Ab, 4th Generation: NONREACTIVE

## 2023-09-28 NOTE — Telephone Encounter (Signed)
 Copied from CRM [id number]. Topic: Clinical - Lab/Test Results >> Sep 28, 2023  2:14 PM Larissa S wrote: Reason for CRM: Patient calling to obtain lab results. Relayed information per provider's note, verbatim. Patient verbalized understanding, no additional questions. Lab appointment scheduled.

## 2023-09-28 NOTE — Addendum Note (Signed)
 Addended by: ELNER NANNY B on: 09/28/2023 01:59 PM   Modules accepted: Orders

## 2023-09-29 ENCOUNTER — Other Ambulatory Visit (INDEPENDENT_AMBULATORY_CARE_PROVIDER_SITE_OTHER)

## 2023-09-29 ENCOUNTER — Ambulatory Visit: Payer: Self-pay | Admitting: Family Medicine

## 2023-09-29 DIAGNOSIS — E875 Hyperkalemia: Secondary | ICD-10-CM

## 2023-09-29 LAB — POTASSIUM: Potassium: 4.1 meq/L (ref 3.5–5.1)

## 2023-11-04 ENCOUNTER — Ambulatory Visit (HOSPITAL_BASED_OUTPATIENT_CLINIC_OR_DEPARTMENT_OTHER)

## 2023-11-04 DIAGNOSIS — R9431 Abnormal electrocardiogram [ECG] [EKG]: Secondary | ICD-10-CM

## 2023-11-04 LAB — ECHOCARDIOGRAM COMPLETE
Area-P 1/2: 3.85 cm2
S' Lateral: 2.49 cm

## 2023-11-04 MED ORDER — PERFLUTREN LIPID MICROSPHERE
1.0000 mL | INTRAVENOUS | Status: AC | PRN
  Administered 2023-11-04: 2 mL via INTRAVENOUS

## 2023-11-07 NOTE — Progress Notes (Unsigned)
 Cardiology Office Note:    Date:  11/08/2023   ID:  Larry Huff, DOB 02-02-1971, MRN [id number]  PCP:  Billy Philippe SAUNDERS, NP  Cardiologist:  None  Electrophysiologist:  None   Referring MD: Billy Philippe SAUNDERS, NP   Chief Complaint  Patient presents with   Cardiomyopathy    History of Present Illness:    Larry Huff is a 53 y.o. male with a hx of asthma, DVT who is referred by Philippe Billy, NP for evaluation of abnormal echocardiogram.  Noted to have abnormal EKG and echocardiogram was ordered.  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  Denies any chest pain, dyspnea, lightheadedness, syncope, lower extremity edema.  Reports rare palpitations.  He is very active, does jujitsu twice per week.  Smoked as a teenager.  Thinks there is a history of heart disease on father side but does not know details.   Past Medical History:  Diagnosis Date   Allergy    Arrhythmia    Asthma    DVT (deep venous thrombosis) (HCC)     Past Surgical History:  Procedure Laterality Date   ACHILLES TENDON REPAIR      Current Medications: Current Meds  Medication Sig   acetaminophen (TYLENOL) 325 MG tablet Take 650 mg by mouth every 6 (six) hours as needed.   aspirin  EC 81 MG tablet Take 1 tablet (81 mg total) by mouth daily.     Allergies:   Egg-derived products, Erythromycin, and Milk-related compounds   Social History   Socioeconomic History   Marital status: Married    Spouse name: Not on file   Number of children: 4   Years of education: Not on file   Highest education level: Some college, no degree  Occupational History   Occupation: Mental Health  Tobacco Use   Smoking status: Never   Smokeless tobacco: Never  Vaping Use   Vaping status: Never Used  Substance and Sexual Activity   Alcohol use: Yes    Comment: 1-2 times a 6 months   Drug use: No   Sexual  activity: Yes    Partners: Female  Other Topics Concern   Not on file  Social History Narrative   Wife - [name]   4 Children - 3 daughters and a son   Exercise - 2-3 x week - martial arts   Enjoys reading, Administrator, sports, martial arts.   Social Drivers of Corporate investment banker Strain: Low Risk  (08/24/2023)   Overall Financial Resource Strain (CARDIA)    Difficulty of Paying Living Expenses: Not hard at all  Food Insecurity: No Food Insecurity (08/24/2023)   Hunger Vital Sign    Worried About Running Out of Food in the Last Year: Never true    Ran Out of Food in the Last Year: Never true  Transportation Needs: No Transportation Needs (08/24/2023)   PRAPARE - Administrator, Civil Service (Medical): No    Lack of Transportation (Non-Medical): No  Physical Activity: Sufficiently Active (08/24/2023)   Exercise Vital Sign    Days of Exercise per Week: 5 days    Minutes of Exercise per Session: 40 min  Stress: No Stress Concern Present (08/24/2023)   [name] of Occupational Health - Occupational Stress Questionnaire    Feeling of Stress : Not at all  Social Connections: Socially Integrated (08/24/2023)   Social Connection and Isolation Panel  Frequency of Communication with Friends and Family: More than three times a week    Frequency of Social Gatherings with Friends and Family: More than three times a week    Attends Religious Services: More than 4 times per year    Active Member of [employer] or Organizations: Yes    Attends Engineer, structural: More than 4 times per year    Marital Status: Married     Family History: The patient's family history includes Asthma in his mother; Heart murmur in his mother.  ROS:   Please see the history of present illness.     All other systems reviewed and are negative.  EKGs/Labs/Other Studies Reviewed:    The following studies were reviewed today:   EKG:   11/08/2023: Normal sinus rhythm, rate 71, LVH with  repolarization abnormalities  Recent Labs: 09/27/2023: ALT 22; BUN 15; Creatinine, Ser 1.36; Hemoglobin 15.6; Platelets 259.0; Sodium 140; TSH 1.86 09/29/2023: Potassium 4.1  Recent Lipid Panel    Component Value Date/Time   CHOL 205 (H) 09/27/2023 1213   TRIG 81.0 09/27/2023 1213   HDL 46.30 09/27/2023 1213   CHOLHDL 4 09/27/2023 1213   VLDL 16.2 09/27/2023 1213   LDLCALC 143 (H) 09/27/2023 1213    Physical Exam:    VS:  BP 132/82   Pulse 71   Ht 6' 3 (1.905 m)   Wt 228 lb (103.4 kg)   SpO2 99%   BMI 28.50 kg/m     Wt Readings from Last 3 Encounters:  11/08/23 228 lb (103.4 kg)  09/27/23 238 lb (108 kg)  07/04/23 268 lb 15.4 oz (122 kg)     GEN:  Well nourished, well developed in no acute distress HEENT: Normal NECK: No JVD; No carotid bruits LYMPHATICS: No lymphadenopathy CARDIAC: RRR, no murmurs, rubs, gallops RESPIRATORY:  Clear to auscultation without rales, wheezing or rhonchi  ABDOMEN: Soft, non-tender, non-distended MUSCULOSKELETAL:  No edema; No deformity  SKIN: Warm and dry NEUROLOGIC:  Alert and oriented x 3 PSYCHIATRIC:  Normal affect   ASSESSMENT:    1. Hypertrophic cardiomyopathy (HCC)   2. 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 - Recommend cardiac MRI for further evaluation - Zio patch to evaluate for arrhythmia  Palpitations: Recommend Zio patch x 14 days to evaluate for arrhythmia as above  DVT: After Achilles tendon tear.  No longer on anticoagulation  RTC in 3 months   Medication Adjustments/Labs and Tests Ordered: Current medicines are reviewed at length with the patient today.  Concerns regarding medicines are outlined above.  Orders Placed This Encounter  Procedures   MR CARDIAC MORPHOLOGY W WO CONTRAST   LONG TERM MONITOR (3-14 DAYS)   EKG 12-Lead   No orders of the defined  types were placed in this encounter.   Patient Instructions  Medication Instructions:  Your physician recommends that you continue on your current medications as directed. Please refer to the Current Medication list given to you today.  *If you need a refill on your cardiac medications before your next appointment, please call your pharmacy*  Lab Work: NONE If you have labs (blood work) drawn today and your tests are completely normal, you will receive your results only by: MyChart Message (if you have MyChart) OR A paper copy in the mail If you have any lab test that is abnormal or  we need to change your treatment, we will call you to review the results.  Testing/Procedures: Your physician has requested that you have a cardiac MRI. Cardiac MRI uses a computer to create images of your heart as its beating, producing both still and moving pictures of your heart and major blood vessels. For further information please visit [email or web address]. Please follow the instruction sheet given to you today for more information.  2 WEEK ZIO MONITOR  THIS WILL BE MAILED TO YOU   Follow-Up: At [facility], you and your health needs are our priority.  As part of our continuing mission to provide you with exceptional heart care, our providers are all part of one team.  This team includes your primary Cardiologist (physician) and Advanced Practice Providers or APPs (Physician Assistants and Nurse Practitioners) who all work together to provide you with the care you need, when you need it.  Your next appointment:   3 month(s)  Provider:   Lonni Nanas, MD OR NP/PA   We recommend signing up for the patient portal called MyChart.  Sign up information is provided on this After Visit Summary.  MyChart is used to connect with patients for Virtual Visits (Telemedicine).  Patients are able to view lab/test results, encounter notes, upcoming appointments, etc.  Non-urgent messages can be sent  to your provider as well.   To learn more about what you can do with MyChart, go to [email or web address].   Other Instructions    ZIO XT- Long Term Monitor Instructions  Your physician has requested you wear a ZIO patch monitor for 14 days.  This is a single patch monitor. [employer] supplies one patch monitor per enrollment. Additional stickers are not available. Please do not apply patch if you will be having a Nuclear Stress Test,  Echocardiogram, Cardiac CT, MRI, or Chest Xray during the period you would be wearing the  monitor. The patch cannot be worn during these tests. You cannot remove and re-apply the  ZIO XT patch monitor.  Your ZIO patch monitor will be mailed 3 day USPS to your address on file. It may take 3-5 days  to receive your monitor after you have been enrolled.  Once you have received your monitor, please review the enclosed instructions. Your monitor  has already been registered assigning a specific monitor serial # to you.  Billing and Patient Assistance Program Information  We have supplied [employer] with any of your insurance information on file for billing purposes. [employer] offers a sliding scale Patient Assistance Program for patients that do not have  insurance, or whose insurance does not completely cover the cost of the ZIO monitor.  You must apply for the Patient Assistance Program to qualify for this discounted rate.  To apply, please call [employer] at [phone], select option 4, select option 2, ask to apply for  Patient Assistance Program. Meredeth will ask your household income, and how many people  are in your household. They will quote your out-of-pocket cost based on that information.  [employer] will also be able to set up a 48-month, interest-free payment plan if needed.  Applying the monitor   Shave hair from upper left chest.  Hold abrader disc by orange tab. Rub abrader in 40 strokes over the upper left chest as  indicated in your monitor  instructions.  Clean area with 4 enclosed alcohol pads. Let dry.  Apply patch as indicated in monitor instructions. Patch will be placed under collarbone on left  side of chest with  arrow pointing upward.  Rub patch adhesive wings for 2 minutes. Remove white label marked 1. Remove the white  label marked 2. Rub patch adhesive wings for 2 additional minutes.  While looking in a mirror, press and release button in center of patch. A small green light will  flash 3-4 times. This will be your only indicator that the monitor has been turned on.  Do not shower for the first 24 hours. You may shower after the first 24 hours.  Press the button if you feel a symptom. You will hear a small click. Record Date, Time and  Symptom in the Patient Logbook.  When you are ready to remove the patch, follow instructions on the last 2 pages of Patient  Logbook. Stick patch monitor onto the last page of Patient Logbook.  Place Patient Logbook in the blue and white box. Use locking tab on box and tape box closed  securely. The blue and white box has prepaid postage on it. Please place it in the mailbox as  soon as possible. Your physician should have your test results approximately 7 days after the  monitor has been mailed back to [facility].  Call [facility] Customer Care at [phone] if you have questions regarding  your ZIO XT patch monitor. Call them immediately if you see an orange light blinking on your  monitor.  If your monitor falls off in less than 4 days, contact our Monitor department at [phone].  If your monitor becomes loose or falls off after 4 days call [employer] at [phone] for  suggestions on securing your monitor    Signed, Lonni LITTIE Nanas, MD  11/08/2023 5:33 PM    [facility]

## 2023-11-08 ENCOUNTER — Ambulatory Visit (HOSPITAL_BASED_OUTPATIENT_CLINIC_OR_DEPARTMENT_OTHER): Admitting: Cardiology

## 2023-11-08 ENCOUNTER — Encounter (HOSPITAL_BASED_OUTPATIENT_CLINIC_OR_DEPARTMENT_OTHER): Payer: Self-pay | Admitting: Cardiology

## 2023-11-08 ENCOUNTER — Ambulatory Visit: Attending: Cardiology

## 2023-11-08 VITALS — BP 132/82 | HR 71 | Ht 75.0 in | Wt 228.0 lb

## 2023-11-08 DIAGNOSIS — R002 Palpitations: Secondary | ICD-10-CM

## 2023-11-08 DIAGNOSIS — I422 Other hypertrophic cardiomyopathy: Secondary | ICD-10-CM | POA: Diagnosis not present

## 2023-11-08 NOTE — Patient Instructions (Addendum)
 Medication Instructions:  Your physician recommends that you continue on your current medications as directed. Please refer to the Current Medication list given to you today.  *If you need a refill on your cardiac medications before your next appointment, please call your pharmacy*  Lab Work: NONE If you have labs (blood work) drawn today and your tests are completely normal, you will receive your results only by: MyChart Message (if you have MyChart) OR A paper copy in the mail If you have any lab test that is abnormal or we need to change your treatment, we will call you to review the results.  Testing/Procedures: Your physician has requested that you have a cardiac MRI. Cardiac MRI uses a computer to create images of your heart as its beating, producing both still and moving pictures of your heart and major blood vessels. For further information please visit [email or web address]. Please follow the instruction sheet given to you today for more information.  2 WEEK ZIO MONITOR  THIS WILL BE MAILED TO YOU   Follow-Up: At [facility], you and your health needs are our priority.  As part of our continuing mission to provide you with exceptional heart care, our providers are all part of one team.  This team includes your primary Cardiologist (physician) and Advanced Practice Providers or APPs (Physician Assistants and Nurse Practitioners) who all work together to provide you with the care you need, when you need it.  Your next appointment:   3 month(s)  Provider:   Lonni Nanas, MD OR NP/PA   We recommend signing up for the patient portal called MyChart.  Sign up information is provided on this After Visit Summary.  MyChart is used to connect with patients for Virtual Visits (Telemedicine).  Patients are able to view lab/test results, encounter notes, upcoming appointments, etc.  Non-urgent messages can be sent to your provider as well.   To learn more about what you can do  with MyChart, go to [email or web address].   Other Instructions    ZIO XT- Long Term Monitor Instructions  Your physician has requested you wear a ZIO patch monitor for 14 days.  This is a single patch monitor. [employer] supplies one patch monitor per enrollment. Additional stickers are not available. Please do not apply patch if you will be having a Nuclear Stress Test,  Echocardiogram, Cardiac CT, MRI, or Chest Xray during the period you would be wearing the  monitor. The patch cannot be worn during these tests. You cannot remove and re-apply the  ZIO XT patch monitor.  Your ZIO patch monitor will be mailed 3 day USPS to your address on file. It may take 3-5 days  to receive your monitor after you have been enrolled.  Once you have received your monitor, please review the enclosed instructions. Your monitor  has already been registered assigning a specific monitor serial # to you.  Billing and Patient Assistance Program Information  We have supplied [employer] with any of your insurance information on file for billing purposes. [employer] offers a sliding scale Patient Assistance Program for patients that do not have  insurance, or whose insurance does not completely cover the cost of the ZIO monitor.  You must apply for the Patient Assistance Program to qualify for this discounted rate.  To apply, please call [employer] at [phone], select option 4, select option 2, ask to apply for  Patient Assistance Program. Meredeth will ask your household income, and how many people  are in your household. They will  quote your out-of-pocket cost based on that information.  [employer] will also be able to set up a 102-month, interest-free payment plan if needed.  Applying the monitor   Shave hair from upper left chest.  Hold abrader disc by orange tab. Rub abrader in 40 strokes over the upper left chest as  indicated in your monitor instructions.  Clean area with 4 enclosed alcohol pads. Let dry.   Apply patch as indicated in monitor instructions. Patch will be placed under collarbone on left  side of chest with arrow pointing upward.  Rub patch adhesive wings for 2 minutes. Remove white label marked 1. Remove the white  label marked 2. Rub patch adhesive wings for 2 additional minutes.  While looking in a mirror, press and release button in center of patch. A small green light will  flash 3-4 times. This will be your only indicator that the monitor has been turned on.  Do not shower for the first 24 hours. You may shower after the first 24 hours.  Press the button if you feel a symptom. You will hear a small click. Record Date, Time and  Symptom in the Patient Logbook.  When you are ready to remove the patch, follow instructions on the last 2 pages of Patient  Logbook. Stick patch monitor onto the last page of Patient Logbook.  Place Patient Logbook in the blue and white box. Use locking tab on box and tape box closed  securely. The blue and white box has prepaid postage on it. Please place it in the mailbox as  soon as possible. Your physician should have your test results approximately 7 days after the  monitor has been mailed back to [facility].  Call [facility] Customer Care at [phone] if you have questions regarding  your ZIO XT patch monitor. Call them immediately if you see an orange light blinking on your  monitor.  If your monitor falls off in less than 4 days, contact our Monitor department at [phone].  If your monitor becomes loose or falls off after 4 days call [employer] at [phone] for  suggestions on securing your monitor

## 2023-11-08 NOTE — Progress Notes (Unsigned)
 Enrolled patient for a 14 day Zio XT  monitor to be mailed to patients home

## 2023-12-20 ENCOUNTER — Encounter (HOSPITAL_COMMUNITY): Payer: Self-pay

## 2023-12-21 ENCOUNTER — Other Ambulatory Visit (HOSPITAL_BASED_OUTPATIENT_CLINIC_OR_DEPARTMENT_OTHER): Payer: Self-pay | Admitting: Cardiology

## 2023-12-21 ENCOUNTER — Encounter (HOSPITAL_BASED_OUTPATIENT_CLINIC_OR_DEPARTMENT_OTHER): Payer: Self-pay | Admitting: Cardiology

## 2023-12-21 ENCOUNTER — Ambulatory Visit (HOSPITAL_COMMUNITY)
Admission: RE | Admit: 2023-12-21 | Discharge: 2023-12-21 | Disposition: A | Discharge status: Home / Self Care | Source: Ambulatory Visit | Attending: Cardiology | Admitting: Cardiology

## 2023-12-21 ENCOUNTER — Ambulatory Visit: Payer: Self-pay | Admitting: Cardiology

## 2023-12-21 DIAGNOSIS — I422 Other hypertrophic cardiomyopathy: Secondary | ICD-10-CM

## 2023-12-21 DIAGNOSIS — R002 Palpitations: Secondary | ICD-10-CM | POA: Diagnosis not present

## 2023-12-21 MED ORDER — GADOBUTROL 1 MMOL/ML IV SOLN
10.0000 mL | Freq: Once | INTRAVENOUS | Status: AC | PRN
  Administered 2023-12-21: 10 mL via INTRAVENOUS

## 2023-12-22 ENCOUNTER — Encounter: Payer: Self-pay | Admitting: Family Medicine

## 2023-12-29 ENCOUNTER — Encounter: Payer: Self-pay | Admitting: Gastroenterology

## 2024-01-01 NOTE — Progress Notes (Unsigned)
 Electrophysiology Office Note:    Date:  01/02/2024   ID:  Larry Huff, DOB 08/24/1970, MRN [id number]  PCP:  Billy Philippe SAUNDERS, NP   [facility] Providers Cardiologist:  None     Referring MD: Kate Lonni CROME*   History of Present Illness:    Larry Huff is a 53 y.o. male with a medical history significant for apical hypertrophic cardiomyopathy, referred for ICD placement.     Discussed the use of AI scribe software for clinical note transcription with the patient, who gave verbal consent to proceed.  History of Present Illness  Echocardiogram was ordered after the patient was noted to have an abnormal though nonspecific ECG.  The echocardiogram was concerning for severe apical LVH with a small apical aneurysm.  Cardiac MRI was performed that showed apical hypertrophy cardiomyopathy with a mid apical thickness of 21 mm and an apically displaced papillary muscle with a small channel and apical aneurysm with distal anterior apical and inferior hyperenhancement consistent with apical variant hypertrophic cardiomyopathy..  He denies any family history of sudden cardiac death, and he has no personal history of syncope or presyncope.  He is very active.  He participates in martial arts and is about to earn his purple belt in jujitsu.      Today, he reports he feels well and has no complaints  EKGs/Labs/Other Studies Reviewed Today:     Echocardiogram:  TTE November 04, 2023 LVEF 70 to 75%.  Severe asymmetric left ventricular hypertrophy of the apical segment.  Small apical aneurysm.   Monitor 14-day ZIO monitor August 2025 Sinus rhythm, heart rate 44 271 bpm, average 68 beats minute 89 episodes of wide-complex tachycardia noted.  Occasional episodes of nonsustained SVT.  Less than 1% ectopy.  Episodes of shortness of breath correlate with sinus rhythm and ectopy.   Advanced imaging:  Cardiac MRI Reviewed in epic --findings consistent with apical  hypertrophic cardiomyopathy   EKG:   EKG Interpretation Date/Time:  Monday January 02 2024 10:06:57 EDT Ventricular Rate:  67 PR Interval:  134 QRS Duration:  86 QT Interval:  428 QTC Calculation: 452 R Axis:   72  Text Interpretation: Normal sinus rhythm Biatrial enlargement Septal infarct (cited on or before 08-Nov-2023) ST & T wave abnormality, consider inferior ischemia ST & T wave abnormality, consider anterolateral ischemia When compared with ECG of 08-Nov-2023 13:32, No significant change was found Confirmed by Nancey Scotts [id number]) on 01/02/2024 10:35:38 AM     Physical Exam:    VS:  BP 124/88 (BP Location: Left Arm, Patient Position: Sitting, Cuff Size: Large)   Pulse 67   Ht 6' 3 (1.905 m)   Wt 228 lb (103.4 kg)   SpO2 97%   BMI 28.50 kg/m     Wt Readings from Last 3 Encounters:  01/02/24 228 lb (103.4 kg)  11/08/23 228 lb (103.4 kg)  09/27/23 238 lb (108 kg)     GEN:  Well nourished, well developed in no acute distress CARDIAC: RRR, no murmurs, rubs, gallops RESPIRATORY:  Normal work of breathing MUSCULOSKELETAL: no edema    ASSESSMENT & PLAN:     Apical hypertrophic cardiomyopathy He has multiple findings with high risk for sudden cardiac death including frequent nonsustained VT and apical variant hypertrophic cardiomyopathy He meets criteria for ICD placement I think because he is so active and relatively young, that he will benefit from a subcutaneous ICD.  I discussed the procedure with him and explained risks including infection, bleeding,  device malfunction. He would like to proceed with scheduling today.     Signed, Eulas FORBES Furbish, MD  01/02/2024 10:36 AM    [facility]

## 2024-01-02 ENCOUNTER — Ambulatory Visit: Attending: Cardiovascular Disease | Admitting: Cardiovascular Disease

## 2024-01-02 ENCOUNTER — Encounter: Payer: Self-pay | Admitting: Cardiovascular Disease

## 2024-01-02 VITALS — BP 124/88 | HR 67 | Ht 75.0 in | Wt 228.0 lb

## 2024-01-02 DIAGNOSIS — Z01812 Encounter for preprocedural laboratory examination: Secondary | ICD-10-CM | POA: Diagnosis not present

## 2024-01-02 DIAGNOSIS — I422 Other hypertrophic cardiomyopathy: Secondary | ICD-10-CM

## 2024-01-02 DIAGNOSIS — R002 Palpitations: Secondary | ICD-10-CM | POA: Diagnosis not present

## 2024-01-02 NOTE — Patient Instructions (Signed)
 Medication Instructions:  Your physician recommends that you continue on your current medications as directed. Please refer to the Current Medication list given to you today.  *If you need a refill on your cardiac medications before your next appointment, please call your pharmacy*  Lab Work: None ordered.  If you have labs (blood work) drawn today and your tests are completely normal, you will receive your results only by: MyChart Message (if you have MyChart) OR A paper copy in the mail If you have any lab test that is abnormal or we need to change your treatment, we will call you to review the results.  Testing/Procedures: Your physician has recommended that you have a defibrillator inserted. An implantable cardioverter defibrillator (ICD) is a small device that is placed in your chest or, in rare cases, your abdomen. This device uses electrical pulses or shocks to help control life-threatening, irregular heartbeats that could lead the heart to suddenly stop beating (sudden cardiac arrest). Leads are attached to the ICD that goes into your heart. This is done in the hospital and usually requires an overnight stay. Please see the instruction sheet given to you today for more information.   Follow-Up: At [facility], you and your health needs are our priority.  As part of our continuing mission to provide you with exceptional heart care, our providers are all part of one team.  This team includes your primary Cardiologist (physician) and Advanced Practice Providers or APPs (Physician Assistants and Nurse Practitioners) who all work together to provide you with the care you need, when you need it.  Your next appointment:   To be scheduled  We recommend signing up for the patient portal called MyChart.  Sign up information is provided on this After Visit Summary.  MyChart is used to connect with patients for Virtual Visits (Telemedicine).  Patients are able to view lab/test results,  encounter notes, upcoming appointments, etc.  Non-urgent messages can be sent to your provider as well.   To learn more about what you can do with MyChart, go to [email or web address].

## 2024-01-20 ENCOUNTER — Encounter: Payer: Self-pay | Admitting: Gastroenterology

## 2024-01-20 ENCOUNTER — Ambulatory Visit (AMBULATORY_SURGERY_CENTER)

## 2024-01-20 VITALS — Ht 75.0 in | Wt 235.0 lb

## 2024-01-20 DIAGNOSIS — Z1211 Encounter for screening for malignant neoplasm of colon: Secondary | ICD-10-CM

## 2024-01-20 MED ORDER — NA SULFATE-K SULFATE-MG SULF 17.5-3.13-1.6 GM/177ML PO SOLN: 1.0000 | Freq: Once | ORAL | 0 refills | Status: AC

## 2024-01-20 NOTE — Progress Notes (Signed)
 Pre visit completed via phone call; Patient verified name, DOB, and address;  Allergies known to patient ==patient reports if he can eat cakes/pies with eggs cooked in them without issues/reactions; patient does eat foods with soy in them and is able to eat soy sauce without issues, and he drinks almond milk but not cow milk;  No issues known to pt with past sedation with any surgeries or procedures; Patient denies ever being told they had issues or difficulty with intubation; No FH of Malignant Hyperthermia; Pt is not on diet pills; Pt is not on home 02;  Pt is not on blood thinners;  Pt denies issues with constipation  No A fib or A flutter; Have any cardiac testing pending--NO Insurance verified during PV appt--- UHC Pt can ambulate without assistance;  Pt denies use of chewing tobacco Discussed diabetic/weight loss medication holds; Discussed NSAID holds; Checked BMI to be less than 50; Pt instructed to use Singlecare.com or GoodRx for a price reduction on prep;   Pre visit completed and red dot placed by patient's name on their procedure day (on provider's schedule);   Instructions sent to MyChart per patient request;

## 2024-01-23 ENCOUNTER — Telehealth: Payer: Self-pay

## 2024-01-23 NOTE — Telephone Encounter (Signed)
 Completed.

## 2024-01-23 NOTE — Telephone Encounter (Signed)
-----   Message from Nurse Aldona R sent at 01/02/2024  1:41 PM EDT ----- Regarding: S-ICD 11/25 Mealor Important: list procedure date as first item in subject line, followed by procedure type (e.g., 12/02/23 PPM implant)  Precert:  MD: Mealor Type of implant: S-ICD Device manufacturer: [employer] Diagnosis: HCM CPT code: Sub-Q ICD implant or replacement - 33270 C-code(s), including quantity (if indicated):  Procedure scheduled (date/time): 11/25 1230  Procedure:  Scrub given? Yes  Medication instructions: Hold ASA x 5 days Message sent to CVRR? N/A Added to calendar? Yes Orders entered? No, >30 days before procedure Letter complete? No, >30 days before procedure Scheduled with cath lab? Yes Labs ordered (CBC, BMET, PT/INR if on warfarin)? Yes Dye allergy? No Pre-meds ordered and instructions given? N/A Letter method: MyChart Special instructions: none H&P: 10/13  Follow-up:  Cassie/Angel, please schedule Routine.  Covering RN:  Please send this message to [employer], EP scheduler, EP Scheduling pool, and EP [employer].

## 2024-01-24 ENCOUNTER — Encounter (HOSPITAL_COMMUNITY): Payer: Self-pay

## 2024-01-24 ENCOUNTER — Telehealth (HOSPITAL_COMMUNITY): Payer: Self-pay

## 2024-01-24 NOTE — Telephone Encounter (Signed)
 Patient returned call to discuss upcoming procedure.      Health status review:  Any new medical conditions, recent signs of acute illness or been started on antibiotics? Patient seen at [facility]- ED on 11/1 for left lower back pain and sciatica pain to left leg. He was given a Rx for Flexeril and Norco.   Follow all medication instructions prior to procedure or the procedure may be rescheduled:    HOLD: Aspirin  for 5 days prior to your procedure. Your last dose will be Wednesday, November 19.  Essential chronic medications:  No medication should be continued, unless told otherwise. On the morning of your procedure DO NOT take any medication. Nothing to eat or drink after midnight prior to your procedure. The night before your procedure and the morning of your procedure, wash thoroughly with the CHG surgical soap from the neck down, paying special attention to the area where your procedure will be performed.  Pre-procedure testing scheduled: lab work by November 11.  Confirmed patient is scheduled for  Subcutaneous ICD  on Tuesday, November 25 with Dr. Dr. Nancey. Instructed patient to arrive at the Main Entrance A at [facility]: [address] [place], [place] [address] and check in at Admitting at 9:30 AM.  You will be staying overnight at the hospital. Please bring necessary items with you.  You MUST have a responsible adult to drive you home and MUST be with you the first 24 hours after you arrive home or your procedure could be cancelled.  Informed a nurse may call a day before the procedure to confirm arrival time and ensure instructions are followed.  Patient verbalized understanding to information provided and is agreeable to proceed with procedure.   Advised to contact RN Navigator at [phone], to inform of any new medications started after call or concerns prior to procedure.

## 2024-01-24 NOTE — Telephone Encounter (Signed)
 Attempted to reach patient to discuss upcoming procedure, no answer. Left VM for patient to return call.

## 2024-01-24 NOTE — Addendum Note (Signed)
 Addended by: Iva Montelongo O on: 01/24/2024 03:23 PM   Modules accepted: Orders

## 2024-01-29 NOTE — Progress Notes (Deleted)
 Cardiology Office Note:    Date:  01/29/2024   ID:  Larry Huff, DOB 02-24-1971, MRN [id number]  PCP:  Billy Philippe SAUNDERS, NP  Cardiologist:  None  Electrophysiologist:  None   Referring MD: Billy Philippe SAUNDERS, NP   No chief complaint on file.   History of Present Illness:    Larry Huff is a 53 y.o. male with a hx of asthma, DVT who presents for follow-up.  He was referred by Philippe Billy, NP for evaluation of abnormal echocardiogram, initially seen in 1925.  Noted to have abnormal EKG and echocardiogram was ordered.  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  Zio patch x 14 days 10/2023 showed 89 episodes of NSVT with longest lasting 13 seconds and 13 episodes of SVT with longest lasting 11 seconds.  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  Since last clinic visit,  Denies any chest pain, dyspnea, lightheadedness, syncope, lower extremity edema.  Reports rare palpitations.  He is very active, does jujitsu twice per week.  Smoked as a teenager.  Thinks there is a history of heart disease on father side but does not know details.   Past Medical History:  Diagnosis Date   Allergy    Arrhythmia    Asthma    DVT (deep venous thrombosis) (HCC) 2015   RIGHT lower leg    Past Surgical History:  Procedure Laterality Date   ACHILLES TENDON REPAIR Right 2015   TOOTH EXTRACTION  2023    Current Medications: No outpatient medications have been marked as taking for the 01/31/24 encounter (Appointment) with Kate Lonni CROME, MD.     Allergies:   Egg protein-containing drug products, Erythromycin, Milk protein, and Milk-related compounds   Social History   Socioeconomic History   Marital status: Married    Spouse name: Not on file   Number of children: 4   Years of education: Not on file   Highest  education level: Some college, no degree  Occupational History   Occupation: Mental Health  Tobacco Use   Smoking status: Never   Smokeless tobacco: Never  Vaping Use   Vaping status: Never Used  Substance and Sexual Activity   Alcohol use: Not Currently   Drug use: No   Sexual activity: Yes    Partners: Female  Other Topics Concern   Not on file  Social History Narrative   Wife - [name]   4 Children - 3 daughters and a son   Exercise - 2-3 x week - martial arts   Enjoys reading, administrator, sports, martial arts.   Social Drivers of Corporate Investment Banker Strain: Low Risk  (08/24/2023)   Overall Financial Resource Strain (CARDIA)    Difficulty of Paying Living Expenses: Not hard at all  Food Insecurity: No Food Insecurity (08/24/2023)   Hunger Vital Sign    Worried About Running Out of Food in the Last Year: Never true    Ran Out of Food in the Last Year: Never true  Transportation Needs: No Transportation Needs (08/24/2023)   PRAPARE - Administrator, Civil Service (Medical): No    Lack of Transportation (Non-Medical): No  Physical Activity: Sufficiently Active (08/24/2023)   Exercise Vital Sign    Days of Exercise per Week: 5 days    Minutes of Exercise per Session: 40 min  Stress: No Stress Concern Present (  08/24/2023)   [employer] - Occupational Stress Questionnaire    Feeling of Stress : Not at all  Social Connections: Socially Integrated (08/24/2023)   Social Connection and Isolation Panel    Frequency of Communication with Friends and Family: More than three times a week    Frequency of Social Gatherings with Friends and Family: More than three times a week    Attends Religious Services: More than 4 times per year    Active Member of [employer] or Organizations: Yes    Attends Engineer, Structural: More than 4 times per year    Marital Status: Married     Family History: The patient's family history includes Asthma in his  mother; Heart murmur in his mother.  ROS:   Please see the history of present illness.     All other systems reviewed and are negative.  EKGs/Labs/Other Studies Reviewed:    The following studies were reviewed today:   EKG:   11/08/2023: Normal sinus rhythm, rate 71, LVH with repolarization abnormalities  Recent Labs: 09/27/2023: ALT 22; BUN 15; Creatinine, Ser 1.36; Hemoglobin 15.6; Platelets 259.0; Sodium 140; TSH 1.86 09/29/2023: Potassium 4.1  Recent Lipid Panel    Component Value Date/Time   CHOL 205 (H) 09/27/2023 1213   TRIG 81.0 09/27/2023 1213   HDL 46.30 09/27/2023 1213   CHOLHDL 4 09/27/2023 1213   VLDL 16.2 09/27/2023 1213   LDLCALC 143 (H) 09/27/2023 1213    Physical Exam:    VS:  There were no vitals taken for this visit.    Wt Readings from Last 3 Encounters:  01/20/24 235 lb (106.6 kg)  01/02/24 228 lb (103.4 kg)  11/08/23 228 lb (103.4 kg)     GEN:  Well nourished, well developed in no acute distress HEENT: Normal NECK: No JVD; No carotid bruits LYMPHATICS: No lymphadenopathy CARDIAC: RRR, no murmurs, rubs, gallops RESPIRATORY:  Clear to auscultation without rales, wheezing or rhonchi  ABDOMEN: Soft, non-tender, non-distended MUSCULOSKELETAL:  No edema; No deformity  SKIN: Warm and dry NEUROLOGIC:  Alert and oriented x 3 PSYCHIATRIC:  Normal affect   ASSESSMENT:    No diagnosis found.  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  Zio patch x 14 days 10/2023 showed 89 episodes of NSVT with longest lasting 13 seconds and 13 episodes of SVT with longest lasting 11 seconds.  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 - Given apical HCM with apical aneurysm and NSVT on monitor, referred to EP for ICD.  Seen by Dr. Nancey, planning subcutaneous ICD on 11/25  Palpitations/NSVT:  Zio patch x 14 days 10/2023 showed 89 episodes of NSVT with longest lasting 13 seconds and 13 episodes of SVT with longest lasting 11 seconds.  Planning ICD as above  DVT: After Achilles tendon tear.  No longer on anticoagulation  RTC in 3 months***   Medication Adjustments/Labs and Tests Ordered: Current medicines are reviewed at length with the patient today.  Concerns regarding medicines are outlined above.  No orders of the defined types were placed in this encounter.  No orders of the defined types were placed in this encounter.   There are no Patient Instructions on file for this visit.   Signed, Lonni LITTIE Nanas, MD  01/29/2024 9:46 PM    [facility]

## 2024-01-30 ENCOUNTER — Telehealth: Payer: Self-pay | Admitting: Gastroenterology

## 2024-01-30 NOTE — Telephone Encounter (Signed)
 Left message for patient to call back

## 2024-01-30 NOTE — Telephone Encounter (Signed)
 PT is scheduled for a colonoscopy on 11/14. He is having issues with sciatic nerve pain and is taking tylenol arthritis for it. He wants to know if this would affect the procedure. Please advise.

## 2024-01-30 NOTE — Telephone Encounter (Signed)
 I have spoken to patient to advise that he may continue tylenol arthritis prior to upcoming procedure.

## 2024-01-31 ENCOUNTER — Encounter: Payer: Self-pay | Admitting: Cardiology

## 2024-01-31 ENCOUNTER — Ambulatory Visit: Attending: Cardiology | Admitting: Cardiology

## 2024-02-01 ENCOUNTER — Telehealth: Payer: Self-pay | Admitting: Gastroenterology

## 2024-02-01 NOTE — Telephone Encounter (Signed)
 Good afternoon Dr. Stacia,   I received a call from this patient wife stating that she needed to reschedule her husband colonoscopy due to him being in a lot of pain from a work relate injury. Patient was scheduled for November the 14 th but was reschedule for December the 22 nd. Please advise.    Thank you

## 2024-02-03 ENCOUNTER — Encounter: Admitting: Gastroenterology

## 2024-02-07 NOTE — Telephone Encounter (Signed)
 Called patient to remind of required pre-procedure lab work still needed. [employer] locations provided. Pt stated he will get completed as soon as possible.

## 2024-02-09 LAB — BASIC METABOLIC PANEL WITH GFR
BUN/Creatinine Ratio: 11 (ref 9–20)
BUN: 12 mg/dL (ref 6–24)
CO2: 24 mmol/L (ref 20–29)
Calcium: 9.7 mg/dL (ref 8.7–10.2)
Chloride: 100 mmol/L (ref 96–106)
Creatinine, Ser: 1.1 mg/dL (ref 0.76–1.27)
Glucose: 100 mg/dL — AB (ref 70–99)
Potassium: 4.8 mmol/L (ref 3.5–5.2)
Sodium: 138 mmol/L (ref 134–144)
eGFR: 81 mL/min/1.73 (ref >59)

## 2024-02-09 LAB — CBC
Hematocrit: 47.3 % (ref 37.5–51.0)
Hemoglobin: 15.6 g/dL (ref 13.0–17.7)
MCH: 29.8 pg (ref 26.6–33.0)
MCHC: 33 g/dL (ref 31.5–35.7)
MCV: 90 fL (ref 79–97)
Platelets: 304 x10E3/uL (ref 150–450)
RBC: 5.24 x10E6/uL (ref 4.14–5.80)
RDW: 13.9 % (ref 11.6–15.4)
WBC: 4.7 x10E3/uL (ref 3.4–10.8)

## 2024-02-13 NOTE — Pre-Procedure Instructions (Signed)
 Called patient to inform of time change for procedure tomorrow.  Left voicemail new arrive time tomorrow is 1100.  Procedure scheduled at 1:00pm.

## 2024-02-13 NOTE — Pre-Procedure Instructions (Signed)
 Attempted to call patient regarding procedure instructions.  Left voicemail on the following items: Arrival time 0800- new time Nothing to eat or drink after midnight No meds AM of procedure Responsible person to drive you home and stay with you for 24 hrs Wash with special soap night before and morning of procedure If on anti-coagulant drug instructions ASA- last dose should have been 11/19

## 2024-02-14 ENCOUNTER — Ambulatory Visit (HOSPITAL_COMMUNITY): Admitting: Certified Registered"

## 2024-02-14 ENCOUNTER — Ambulatory Visit (HOSPITAL_COMMUNITY)
Admission: RE | Admit: 2024-02-14 | Discharge: 2024-02-15 | Disposition: A | Discharge status: Home / Self Care | Attending: Cardiovascular Disease | Admitting: Cardiovascular Disease

## 2024-02-14 ENCOUNTER — Other Ambulatory Visit: Payer: Self-pay

## 2024-02-14 ENCOUNTER — Ambulatory Visit: Payer: Self-pay | Admitting: Cardiovascular Disease

## 2024-02-14 ENCOUNTER — Ambulatory Visit (HOSPITAL_COMMUNITY)
Admission: RE | Disposition: A | Discharge status: Home / Self Care | Payer: Self-pay | Source: Home / Self Care | Attending: Cardiovascular Disease

## 2024-02-14 DIAGNOSIS — I471 Supraventricular tachycardia, unspecified: Secondary | ICD-10-CM | POA: Insufficient documentation

## 2024-02-14 DIAGNOSIS — Z86718 Personal history of other venous thrombosis and embolism: Secondary | ICD-10-CM | POA: Insufficient documentation

## 2024-02-14 DIAGNOSIS — I10 Essential (primary) hypertension: Secondary | ICD-10-CM

## 2024-02-14 DIAGNOSIS — I422 Other hypertrophic cardiomyopathy: Secondary | ICD-10-CM

## 2024-02-14 DIAGNOSIS — Z6829 Body mass index (BMI) 29.0-29.9, adult: Secondary | ICD-10-CM

## 2024-02-14 DIAGNOSIS — J45909 Unspecified asthma, uncomplicated: Secondary | ICD-10-CM | POA: Diagnosis not present

## 2024-02-14 DIAGNOSIS — E66811 Obesity, class 1: Secondary | ICD-10-CM

## 2024-02-14 DIAGNOSIS — Z95810 Presence of automatic (implantable) cardiac defibrillator: Secondary | ICD-10-CM | POA: Diagnosis present

## 2024-02-14 HISTORY — PX: SUBQ ICD IMPLANT: EP1223

## 2024-02-14 MED ORDER — ACETAMINOPHEN 325 MG PO TABS
325.0000 mg | ORAL_TABLET | ORAL | Status: DC | PRN
  Administered 2024-02-15: 650 mg via ORAL
  Filled 2024-02-14: qty 2

## 2024-02-14 MED ORDER — PROPOFOL 10 MG/ML IV BOLUS
INTRAVENOUS | Status: DC | PRN
  Administered 2024-02-14: 200 mg via INTRAVENOUS

## 2024-02-14 MED ORDER — BUPIVACAINE HCL (PF) 0.25 % IJ SOLN
INTRAMUSCULAR | Status: DC | PRN
  Administered 2024-02-14 (×2): 30 mL

## 2024-02-14 MED ORDER — FENTANYL CITRATE (PF) 100 MCG/2ML IJ SOLN
INTRAMUSCULAR | Status: AC
  Filled 2024-02-14: qty 2

## 2024-02-14 MED ORDER — CEFAZOLIN SODIUM-DEXTROSE 1-4 GM/50ML-% IV SOLN
1.0000 g | Freq: Four times a day (QID) | INTRAVENOUS | Status: AC
  Administered 2024-02-14 – 2024-02-15 (×3): 1 g via INTRAVENOUS
  Filled 2024-02-14 (×3): qty 50

## 2024-02-14 MED ORDER — FENTANYL CITRATE (PF) 250 MCG/5ML IJ SOLN
INTRAMUSCULAR | Status: DC | PRN
  Administered 2024-02-14 (×2): 50 ug via INTRAVENOUS

## 2024-02-14 MED ORDER — CEFAZOLIN SODIUM-DEXTROSE 2-4 GM/100ML-% IV SOLN
2.0000 g | INTRAVENOUS | Status: AC
  Administered 2024-02-14: 2 g via INTRAVENOUS

## 2024-02-14 MED ORDER — SODIUM CHLORIDE 0.9 % IV SOLN
INTRAVENOUS | Status: AC
  Administered 2024-02-14: 80 mg
  Filled 2024-02-14: qty 2

## 2024-02-14 MED ORDER — DEXAMETHASONE SOD PHOSPHATE PF 10 MG/ML IJ SOLN
INTRAMUSCULAR | Status: DC | PRN
  Administered 2024-02-14: 10 mg via INTRAVENOUS

## 2024-02-14 MED ORDER — SODIUM CHLORIDE 0.9 % IV SOLN: 80.0000 mg | INTRAVENOUS | Status: AC

## 2024-02-14 MED ORDER — ONDANSETRON HCL 4 MG/2ML IJ SOLN: 4.0000 mg | Freq: Four times a day (QID) | INTRAMUSCULAR | Status: DC | PRN

## 2024-02-14 MED ORDER — CHLORHEXIDINE GLUCONATE 4 % EX SOLN
4.0000 | Freq: Once | CUTANEOUS | Status: DC
  Filled 2024-02-14: qty 60

## 2024-02-14 MED ORDER — HEPARIN (PORCINE) IN NACL 1000-0.9 UT/500ML-% IV SOLN
INTRAVENOUS | Status: DC | PRN
  Administered 2024-02-14: 500 mL

## 2024-02-14 MED ORDER — PHENYLEPHRINE 80 MCG/ML (10ML) SYRINGE FOR IV PUSH (FOR BLOOD PRESSURE SUPPORT)
PREFILLED_SYRINGE | INTRAVENOUS | Status: DC | PRN
  Administered 2024-02-14: 80 ug via INTRAVENOUS

## 2024-02-14 MED ORDER — ESMOLOL HCL 100 MG/10ML IV SOLN
INTRAVENOUS | Status: DC | PRN
  Administered 2024-02-14 (×2): 10 mg via INTRAVENOUS

## 2024-02-14 MED ORDER — PHENYLEPHRINE HCL-NACL 20-0.9 MG/250ML-% IV SOLN
INTRAVENOUS | Status: DC | PRN
  Administered 2024-02-14: 25 ug/min via INTRAVENOUS

## 2024-02-14 MED ORDER — SUGAMMADEX SODIUM 200 MG/2ML IV SOLN
INTRAVENOUS | Status: DC | PRN
  Administered 2024-02-14: 200 mg via INTRAVENOUS

## 2024-02-14 MED ORDER — ONDANSETRON HCL 4 MG/2ML IJ SOLN
INTRAMUSCULAR | Status: DC | PRN
  Administered 2024-02-14: 4 mg via INTRAVENOUS

## 2024-02-14 MED ORDER — MIDAZOLAM HCL (PF) 2 MG/2ML IJ SOLN
INTRAMUSCULAR | Status: DC | PRN
  Administered 2024-02-14: 2 mg via INTRAVENOUS

## 2024-02-14 MED ORDER — ROCURONIUM BROMIDE 10 MG/ML (PF) SYRINGE
PREFILLED_SYRINGE | INTRAVENOUS | Status: DC | PRN
  Administered 2024-02-14: 60 mg via INTRAVENOUS

## 2024-02-14 MED ORDER — SODIUM CHLORIDE 0.9 % IV SOLN: INTRAVENOUS | Status: DC

## 2024-02-14 MED ORDER — CEFAZOLIN SODIUM-DEXTROSE 2-4 GM/100ML-% IV SOLN
INTRAVENOUS | Status: AC
  Filled 2024-02-14: qty 100

## 2024-02-14 MED ORDER — POVIDONE-IODINE 10 % EX SWAB
2.0000 | Freq: Once | CUTANEOUS | Status: AC
  Administered 2024-02-14: 2 via TOPICAL

## 2024-02-14 MED ORDER — BUPIVACAINE HCL (PF) 0.25 % IJ SOLN
INTRAMUSCULAR | Status: AC
  Filled 2024-02-14: qty 60

## 2024-02-14 MED ORDER — BUPIVACAINE HCL (PF) 0.25 % IJ SOLN
INTRAMUSCULAR | Status: AC
  Filled 2024-02-14: qty 30

## 2024-02-14 MED ORDER — MIDAZOLAM HCL 2 MG/2ML IJ SOLN
INTRAMUSCULAR | Status: AC
  Filled 2024-02-14: qty 2

## 2024-02-14 MED ORDER — LIDOCAINE 2% (20 MG/ML) 5 ML SYRINGE
INTRAMUSCULAR | Status: DC | PRN
  Administered 2024-02-14: 45 mg via INTRAVENOUS

## 2024-02-14 NOTE — H&P (Signed)
  Electrophysiology Office Note:    Date:  02/14/2024   ID:  Larry Huff, DOB 1970-08-04, MRN [id number]  PCP:  Billy Philippe SAUNDERS, NP   [facility] Providers Cardiologist:  None     Referring MD: No ref. provider found   History of Present Illness:    Larry Huff is a 53 y.o. male with a medical history significant for apical hypertrophic cardiomyopathy, referred for ICD placement.     Discussed the use of AI scribe software for clinical note transcription with the patient, who gave verbal consent to proceed.  History of Present Illness  Echocardiogram was ordered after the patient was noted to have an abnormal though nonspecific ECG.  The echocardiogram was concerning for severe apical LVH with a small apical aneurysm.  Cardiac MRI was performed that showed apical hypertrophy cardiomyopathy with a mid apical thickness of 21 mm and an apically displaced papillary muscle with a small channel and apical aneurysm with distal anterior apical and inferior hyperenhancement consistent with apical variant hypertrophic cardiomyopathy..  He denies any family history of sudden cardiac death, and he has no personal history of syncope or presyncope.  He is very active.  He participates in martial arts and is about to earn his purple belt in jujitsu.      Today, he reports he feels well and has no complaints other than that he's a bit nervous about the procedure. Since his last visit with me, he has had sciatic nerve problems from an issue at work.  EKGs/Labs/Other Studies Reviewed Today:     Echocardiogram:  TTE November 04, 2023 LVEF 70 to 75%.  Severe asymmetric left ventricular hypertrophy of the apical segment.  Small apical aneurysm.   Monitor 14-day ZIO monitor August 2025 Sinus rhythm, heart rate 44 271 bpm, average 68 beats minute 89 episodes of wide-complex tachycardia noted.  Occasional episodes of nonsustained SVT.  Less than 1% ectopy.  Episodes of shortness of  breath correlate with sinus rhythm and ectopy.   Advanced imaging:  Cardiac MRI Reviewed in epic --findings consistent with apical hypertrophic cardiomyopathy   EKG:         Physical Exam:    VS:  BP (!) 143/87   Pulse 80   Temp 98.6 F (37 C) (Oral)   Resp 17   Ht 6' 3 (1.905 m)   Wt 106.6 kg   SpO2 98%   BMI 29.37 kg/m     Wt Readings from Last 3 Encounters:  02/14/24 106.6 kg  01/20/24 106.6 kg  01/02/24 103.4 kg     GEN:  Well nourished, well developed in no acute distress CARDIAC: RRR, no murmurs, rubs, gallops RESPIRATORY:  Normal work of breathing MUSCULOSKELETAL: no edema    ASSESSMENT & PLAN:     Apical hypertrophic cardiomyopathy He has multiple findings with high risk for sudden cardiac death including frequent nonsustained VT and apical variant hypertrophic cardiomyopathy He meets criteria for ICD placement I think because he is so active and relatively young, that he will benefit from a subcutaneous ICD.  I discussed the procedure with him and explained risks including infection, bleeding, device malfunction. He would like to proceed with scheduling today.     Signed, Eulas FORBES Furbish, MD  02/14/2024 12:33 PM    [facility]

## 2024-02-14 NOTE — Transfer of Care (Signed)
 Immediate Anesthesia Transfer of Care Note  Patient: Larry Huff  Procedure(s) Performed: SUBQ ICD IMPLANT  Patient Location: PACU  Anesthesia Type:General  Level of Consciousness: awake, alert , and oriented  Airway & Oxygen Therapy: Patient Spontanous Breathing and Patient connected to nasal cannula oxygen  Post-op Assessment: Report given to RN and Post -op Vital signs reviewed and stable  Post vital signs: Reviewed and stable  Last Vitals:  Vitals Value Taken Time  BP    Temp    Pulse 89 02/14/24 15:00  Resp 13 02/14/24 15:00  SpO2 100 % 02/14/24 15:00  Vitals shown include unfiled device data.  Last Pain:  Vitals:   02/14/24 1200  TempSrc:   PainSc: 5          Complications: There were no known notable events for this encounter.

## 2024-02-14 NOTE — Anesthesia Preprocedure Evaluation (Addendum)
 Anesthesia Evaluation  Patient identified by MRN, date of birth, ID band Patient awake    Reviewed: Allergy & Precautions, H&P , NPO status , Patient's Chart, lab work & pertinent test results, reviewed documented beta blocker date and time   Airway Mallampati: III  TM Distance: >3 FB Neck ROM: Full    Dental  (+) Teeth Intact, Dental Advisory Given   Pulmonary asthma    Pulmonary exam normal breath sounds clear to auscultation       Cardiovascular hypertension (143/87 preop), Pt. on home beta blockers + DVT (2015 RLE)  Normal cardiovascular exam Rhythm:Regular Rate:Normal  Severe atypical hypertrophic CM  Echo 10/2023:  1. Left ventricular ejection fraction, by estimation, is 70 to 75%. The  left ventricle has hyperdynamic function. The left ventricle has no  regional wall motion abnormalities. There is severe asymmetric left  ventricular hypertrophy of the apical  segment. Left ventricular diastolic parameters were normal. Small apical  aneurysm   2. Right ventricular systolic function is normal. The right ventricular  size is normal. Tricuspid regurgitation signal is inadequate for assessing  PA pressure.   3. The mitral valve is normal in structure. Trivial mitral valve  regurgitation. No evidence of mitral stenosis.   4. The aortic valve is tricuspid. Aortic valve regurgitation is not  visualized. No aortic stenosis is present.   5. The inferior vena cava is dilated in size with >50% respiratory  variability, suggesting right atrial pressure of 8 mmHg.   14-day ZIO monitor August 2025 Sinus rhythm, heart rate 44 271 bpm, average 68 beats minute 89 episodes of wide-complex tachycardia noted.  Occasional episodes of nonsustained SVT.  Less than 1% ectopy.  Episodes of shortness of breath correlate with sinus rhythm and ectopy.     Neuro/Psych negative neurological ROS  negative psych ROS   GI/Hepatic negative GI ROS,  Neg liver ROS,,,  Endo/Other  negative endocrine ROS    Renal/GU negative Renal ROS  negative genitourinary   Musculoskeletal negative musculoskeletal ROS (+)    Abdominal   Peds negative pediatric ROS (+)  Hematology negative hematology ROS (+)   Anesthesia Other Findings   Reproductive/Obstetrics negative OB ROS                              Anesthesia Physical Anesthesia Plan  ASA: 3  Anesthesia Plan: General   Post-op Pain Management:    Induction: Intravenous  PONV Risk Score and Plan: 2 and Ondansetron , Dexamethasone , Treatment may vary due to age or medical condition and Midazolam   Airway Management Planned: Oral ETT  Additional Equipment: None  Intra-op Plan:   Post-operative Plan: Extubation in OR  Informed Consent: I have reviewed the patients History and Physical, chart, labs and discussed the procedure including the risks, benefits and alternatives for the proposed anesthesia with the patient or authorized representative who has indicated his/her understanding and acceptance.     Dental advisory given  Plan Discussed with: CRNA  Anesthesia Plan Comments:          Anesthesia Quick Evaluation

## 2024-02-14 NOTE — Plan of Care (Signed)

## 2024-02-14 NOTE — Anesthesia Postprocedure Evaluation (Signed)
 Anesthesia Post Note  Patient: Larry Huff  Procedure(s) Performed: SUBQ ICD IMPLANT     Patient location during evaluation: PACU Anesthesia Type: General Level of consciousness: awake and alert, oriented and patient cooperative Pain management: pain level controlled Vital Signs Assessment: post-procedure vital signs reviewed and stable Respiratory status: spontaneous breathing, nonlabored ventilation and respiratory function stable Cardiovascular status: blood pressure returned to baseline and stable Postop Assessment: no apparent nausea or vomiting Anesthetic complications: no   There were no known notable events for this encounter.  Last Vitals:  Vitals:   02/14/24 1515 02/14/24 1525  BP: 132/81 131/82  Pulse: 80 73  Resp: 18 18  Temp:    SpO2: 96% 96%    Last Pain:  Vitals:   02/14/24 1500  TempSrc: Oral  PainSc: 0-No pain   Pain Goal:                   Larry Huff

## 2024-02-14 NOTE — Anesthesia Procedure Notes (Signed)
 Procedure Name: Intubation Date/Time: 02/14/2024 1:29 PM  Performed by: Delores Dus, CRNAPre-anesthesia Checklist: Patient identified, Emergency Drugs available, Suction available and Patient being monitored Patient Re-evaluated:Patient Re-evaluated prior to induction Oxygen Delivery Method: Circle system utilized Preoxygenation: Pre-oxygenation with 100% oxygen Induction Type: IV induction Ventilation: Mask ventilation without difficulty Laryngoscope Size: Miller and 3 Grade View: Grade I Tube type: Oral Tube size: 7.0 mm Number of attempts: 2 Airway Equipment and Method: Stylet and Oral airway Placement Confirmation: ETT inserted through vocal cords under direct vision, positive ETCO2 and breath sounds checked- equal and bilateral Secured at: 24 cm Tube secured with: Tape Dental Injury: Teeth and Oropharynx as per pre-operative assessment

## 2024-02-14 NOTE — Discharge Summary (Signed)
 ELECTROPHYSIOLOGY PROCEDURE DISCHARGE SUMMARY    Patient ID: Larry Huff,  MRN: [id number], DOB/AGE: 1970/09/14 53 y.o.  Admit date: 02/14/2024 Discharge date: 02/15/24   Primary Care Physician: Billy Philippe SAUNDERS, NP  Primary Cardiologist: None  Electrophysiologist: Dr. Nancey    Primary Diagnosis:  Apical Variant Hypertrophic Cardiomyopathy NSVT   Allergies  Allergen Reactions   Egg Protein-Containing Drug Products Anaphylaxis and Nausea Only   Erythromycin Hives   Milk Protein Diarrhea   Milk-Related Compounds Diarrhea     Procedures This Admission:  1.  Implantation of a Architect on 02/14/2024 by Dr. Nancey as detailed in op note.   Implant Name Type Inv. Item Serial No. Manufacturer Lot No. LRB No. Used Action  LEAD SUBQU EMBLEM 3501 - [id number] Pacemaker LEAD SUBQU EMBLEM 3501 [id number] [employer]  N/A 1 Implanted  ICD SUBCU MRI EMBLEM A219 - [id number] ICD Generator ICD SUBCU MRI EMBLEM A219 [id number] [employer]  N/A 1 Implanted    2.  CXR on 02/15/2024 demonstrated no pneumothorax status post device implantation.      Brief HPI: Larry Huff is a 53 y.o. male was referred to electrophysiology in the outpatient setting  for consideration of ICD implantation.  Past medical history includes above.  The patient has Apical Variant Hypertrophic Cardiomyopathy and is thus at increased risk of sudden cardiac death.  Risks, benefits, and alternatives to ICD implantation were reviewed with the patient who wished to proceed.   Hospital Course:  The patient was admitted and underwent implantation of a [employer] S-ICD with details as outlined above. They were monitored on telemetry overnight which demonstrated NSR .  Left chest and mid axillary was without hematoma or ecchymosis.  The device was interrogated and found to be functioning normally.  CXR was obtained and demonstrated no pneumothorax status post device  implantation..  Wound care, arm mobility, and restrictions were reviewed with the patient.  The patient was examined and considered stable for discharge to home.   The patient's discharge medications include a beta blocker (Toprol).  Pt does not have LV dysfunction and is thus not on ACE/ARB/ARNI.   Anticoagulation resumption This patient is not on anticoagulation.  Physical Exam: Vitals:   02/14/24 2000 02/14/24 2200 02/14/24 2300 02/15/24 0430  BP: 118/69 102/72 (!) 113/57 108/75  Pulse: 77 71 70 61  Resp: 16  20 16   Temp:   97.9 F (36.6 C) 98 F (36.7 C)  TempSrc: Oral  Oral Oral  SpO2: 97% 99% 97% 98%  Weight:      Height:        GEN- NAD. A&O x 3.  HEENT: Normocephalic, atraumatic Lungs- CTAB, normal effort.  Heart- RRR. No M/G/R.  GI- Soft, NT, ND.  Extremities- No clubbing, cyanosis, or edema Skin- Warm and dry, no rash or lesion. ICD site stable  Discharge Medications:  Allergies as of 02/15/2024       Reactions   Egg Protein-containing Drug Products Anaphylaxis, Nausea Only   Erythromycin Hives   Milk Protein Diarrhea   Milk-related Compounds Diarrhea        Medication List     TAKE these medications    acetaminophen  325 MG tablet Commonly known as: TYLENOL  Take 650 mg by mouth every 6 (six) hours as needed.   aspirin  EC 81 MG tablet Take 1 tablet (81 mg total) by mouth daily.   cyclobenzaprine 10 MG tablet Commonly known as: FLEXERIL Take  10 mg by mouth 3 (three) times daily as needed.   metoprolol succinate 25 MG 24 hr tablet Commonly known as: TOPROL-XL Take 25 mg by mouth daily.   predniSONE  10 MG (21) Tbpk tablet Commonly known as: STERAPRED UNI-PAK 21 TAB Take 10 mg by mouth daily. 60mg  once daily x1 day, 50mg  daily x1 day, 40mg  daily x1 day, 30mg  daily x1 day, 20mg  daily x1 day, 10mg  daily x1 day        Disposition: Home with usual follow up as in AVS  Duration of Discharge Encounter:  APP time: 22 minutes  minutes  Signed, Ozell Prentice Passey, PA-C  02/15/2024 7:42 AM

## 2024-02-15 ENCOUNTER — Encounter: Payer: Self-pay | Admitting: Student

## 2024-02-15 ENCOUNTER — Encounter (HOSPITAL_COMMUNITY): Payer: Self-pay | Admitting: Cardiovascular Disease

## 2024-02-15 ENCOUNTER — Ambulatory Visit (HOSPITAL_COMMUNITY)

## 2024-02-15 DIAGNOSIS — I422 Other hypertrophic cardiomyopathy: Secondary | ICD-10-CM | POA: Diagnosis not present

## 2024-02-15 MED FILL — Bupivacaine HCl Preservative Free (PF) Inj 0.25%: INTRAMUSCULAR | Qty: 30 | Status: AC

## 2024-02-15 NOTE — Discharge Instructions (Addendum)
 Subcutaneous Cardioverter Defibrillator Implantation, Care After  After Your ICD (Implantable Cardiac Defibrillator)   You have a Environmental manager Subcutaneous ICD  ACTIVITY  Do not lift, push, pull, or carry anything over 10 pounds with the until you are instructed it is safe to do so by your provider.   You may drive AFTER your wound check, unless you have been told otherwise by your provider.   Ask your healthcare provider when you can go back to work and resume heavy lifting or exertion.    INCISION/Dressing If you are on a blood thinner such as Coumadin, Xarelto, Eliquis, Plavix, or Pradaxa please confirm with your provider when this should be resumed.   If large square, outer bandage is left in place, this can be removed after 24 hours from your procedure. Do not remove steri-strips or glue as below.   Monitor your defibrillator sites for redness, swelling, and drainage. Call the device clinic at [phone] if you experience these symptoms or fever/chills.  If your incision is sealed with Steri-strips or staples, you may shower 7 days after your procedure or when told by your provider. Do not remove the steri-strips or let the shower hit directly on your site. You may wash around your site with soap and water.    If you were discharged in a sling, please do not wear this during the day more than 48 hours after your surgery unless otherwise instructed. This may increase the risk of stiffness and soreness in your shoulder.   Avoid lotions, ointments, or perfumes over your incision until it is well-healed.  You may use a hot tub or a pool AFTER your wound check appointment if the incision is completely closed.  Your ICD is designed to protect you from life threatening heart rhythms. Because of this, you may receive a shock.   1 shock with no symptoms:  Call the office during business hours. 1 shock with symptoms (chest pain, chest pressure, dizziness, lightheadedness,  shortness of breath, overall feeling unwell):  Call 911. If you experience 2 or more shocks in 24 hours:  Call 911. If you receive a shock, you should not drive for 6 months per the [place] DMV IF you receive appropriate therapy from your ICD.   ICD Alerts:  Some alerts are vibratory and others beep. These are NOT emergencies. Please call our office to let us know. If this occurs at night or on weekends, it can wait until the next business day. Send a remote transmission.  DEVICE MANAGEMENT Remote monitoring is used to monitor your ICD from home. This monitoring is scheduled every 91 days by our office. It allows [place] to keep an eye on the functioning of your device to ensure it is working properly. You will routinely see your Electrophysiologist annually (more often if necessary).   You should receive your ID card for your new device in 4-8 weeks. Keep this card with you at all times once received. Consider wearing a medical alert bracelet or necklace.  Your ICD may be MRI compatible. This will be discussed at your next office visit/wound check.  You should avoid contact with strong electric or magnetic fields.   Do not use amateur (ham) radio equipment or electric (arc) welding torches. MP3 player headphones with magnets should not be used. Some devices are safe to use if held at least 12 inches (30 cm) from your defibrillator. These include power tools, lawn mowers, and speakers. If you are unsure if something is safe to  use, ask your health care provider.  When using your cell phone, hold it to the ear that is on the opposite side from the defibrillator. Do not leave your cell phone in a pocket over the defibrillator.  You may safely use electric blankets, heating pads, computers, and microwave ovens.  Call the office right away if: You have chest pain. You feel more than one shock. You feel more short of breath than you have felt before. You feel more light-headed than you have felt  before. Your incision starts to open up.  This information is not intended to replace advice given to you by your health care provider. Make sure you discuss any questions you have with your health care provider.

## 2024-02-22 ENCOUNTER — Telehealth: Payer: Self-pay

## 2024-02-22 NOTE — Telephone Encounter (Signed)
 Follow-up after same day discharge: Implant date: 02/14/2024 MD: AM Device: ICD Location: L chest    Wound check visit: 02/28/2024 90 day MD follow-up: 05/16/2024  Remote Transmission received:yes  Dressing/sling removed: n/a  Confirm OAC restart on: n/a  Please continue to monitor your cardiac device site for redness, swelling, and drainage. Call the device clinic at [phone] if you experience these symptoms, fever/chills, or have questions about your device.   Remote monitoring is used to monitor your cardiac device from home. This monitoring is scheduled every 91 days by our office. It allows us  to keep an eye on the functioning of your device to ensure it is working properly.

## 2024-02-28 ENCOUNTER — Ambulatory Visit: Attending: Cardiology

## 2024-02-28 DIAGNOSIS — I422 Other hypertrophic cardiomyopathy: Secondary | ICD-10-CM

## 2024-02-28 LAB — CUP PACEART INCLINIC DEVICE CHECK
Date Time Interrogation Session: 20251209141837
Implantable Lead Connection Status: 753985
Implantable Lead Implant Date: 20251125
Implantable Lead Location: 753859
Implantable Lead Model: 3501
Implantable Lead Serial Number: 294635
Implantable Pulse Generator Implant Date: 20251125
Pulse Gen Serial Number: 330565

## 2024-02-28 NOTE — Patient Instructions (Signed)
   After Your ICD (Implantable Cardiac Defibrillator)    Monitor your defibrillator site for redness, swelling, and drainage. Call the device clinic at [phone] if you experience these symptoms or fever/chills.  Your incision was closed with Steri-strips or staples:  You may shower after today's visit and wash your incision with soap and water. Avoid lotions, ointments, or perfumes over your incision until it is well-healed.  You may use a hot tub or a pool after your wound check appointment if the incision is completely closed.  Your ICD is designed to protect you from life threatening heart rhythms. Because of this, you may receive a shock.   1 shock with no symptoms:  Call the office during business hours. 1 shock with symptoms (chest pain, chest pressure, dizziness, lightheadedness, shortness of breath, overall feeling unwell):  Call 911. If you experience 2 or more shocks in 24 hours:  Call 911. If you receive a shock, you should not drive.  [place] DMV - no driving for 6 months if you receive appropriate therapy from your ICD.    Remote monitoring is used to monitor your ICD from home. This monitoring is scheduled every 91 days by our office. It allows us  to keep an eye on the functioning of your device to ensure it is working properly. You will routinely see your Electrophysiologist annually (more often if necessary).

## 2024-02-28 NOTE — Progress Notes (Signed)
 Subcutaneous ICD wound check in clinic. Wound well healed. 0 untreated episodes; 0 treated episodes; 0 shocks delivered. Electrode impedance status okay. No programming changes. Remaining longevity to ERI 100%.

## 2024-03-09 ENCOUNTER — Ambulatory Visit: Payer: Self-pay | Admitting: Cardiovascular Disease

## 2024-03-09 ENCOUNTER — Telehealth: Payer: Self-pay | Admitting: Gastroenterology

## 2024-03-09 NOTE — Telephone Encounter (Signed)
 New prep instructions sent via Mychart to patient.  Prep instructions reviewed with patient. Patient states he already has the [employer]  Patient verbalizes understanding and all questions answered.

## 2024-03-09 NOTE — Telephone Encounter (Signed)
 PT has no instructions for his procedure 12/22. Requesting that a nurse call and go over prep with him.

## 2024-03-11 ENCOUNTER — Encounter (HOSPITAL_BASED_OUTPATIENT_CLINIC_OR_DEPARTMENT_OTHER): Payer: Self-pay | Admitting: Cardiology

## 2024-03-12 ENCOUNTER — Telehealth: Payer: Self-pay | Admitting: *Deleted

## 2024-03-12 ENCOUNTER — Telehealth: Payer: Self-pay | Admitting: Gastroenterology

## 2024-03-12 ENCOUNTER — Encounter: Admitting: Gastroenterology

## 2024-03-12 DIAGNOSIS — Z1211 Encounter for screening for malignant neoplasm of colon: Secondary | ICD-10-CM

## 2024-03-12 NOTE — Telephone Encounter (Signed)
" °  °  Patient Name: Larry Huff  DOB: 03/08/1971 MRN: [id number]  Primary Cardiologist: Lonni LITTIE Nanas, MD  Chart reviewed as part of pre-operative protocol coverage.   The patient already has an upcoming appointment scheduled 03/19/24 with Dr Nanas. at which time this clearance should be addressed. I had already reviewed with GI team - colonoscopy for today already deferred and is pending rescheduling once re-evaluated by cardiologist. Patient recently started on metoprolol by [facility] in 12/2023, also recently had S-ICD placed 02/14/24.  - I added preop comment to appointment notes so that provider is aware to address at time of OV. Per office protocol, the provider seeing this patient should forward their finalized clearance decision to requesting party below.  - Will fax update to requesting party so they are aware. Will remove from preop box as separate preop APP input not necessary at this time.  - Internal team note: when patient replies to MyChart message, would recommend he keep follow-up 12/29 as scheduled to review clearance.  [name], PA-C 03/12/2024, 11:06 AM  "

## 2024-03-12 NOTE — Telephone Encounter (Signed)
 Patient does not have cardiac clearance for today's procedure. Could you please make sure cardiac clearance is completed before rescheduling him.

## 2024-03-12 NOTE — Telephone Encounter (Signed)
 Reviewed with Duwaine Mae and Naomie Sharps with GI team on secure chat. Duwaine confirms patient's procedure has already been cancelled at that he was aware/fine with it. Naomie will be working on sending a formal request to the preop pool. Will retain this msg in inbox until that is received to keep one consolidated location for preoperative communication.

## 2024-03-12 NOTE — Telephone Encounter (Signed)
 PT Called to see if he was cleared to go under anesthesia for his colonoscopy today 12/22 at 9:30 am, he had a defibrillator placed 11/25 and went under anesthesia. I do not see a clearance in his chart. I have advised PT to call GI Doctor because he is not sure if they are aware that he has a defibrillator.

## 2024-03-12 NOTE — Telephone Encounter (Signed)
 See separate note dated 03/12/24. Cardiac clearance request has been sent to cardiology preop team.  Will await their response before rescheduling patient for colonoscopy.

## 2024-03-12 NOTE — Telephone Encounter (Signed)
 Request for surgical clearance:     Endoscopy Procedure  What type of surgery is being performed?     colonoscopy  When is this surgery scheduled?     TBD  What type of clearance is required ?   CARDIAC  Are there any medications that need to be held prior to surgery and how long? N/a  Practice name and name of physician performing surgery?      [facility]  What is your office phone and fax number?      Phone- [phone]  Fax- [phone]  Anesthesia type (None, local, MAC, general) ?       MAC  Please route your response to Naomie Sharps, RN  Thank you!!

## 2024-03-12 NOTE — Telephone Encounter (Signed)
 Inbound call from patient stating he had a defibrillator inserted on November 25th and just received a message from his cardiologist stating he does not have clearance for a procedure but patient would like to speak to nurse since he has prepped for procedure  Please advise  Than you

## 2024-03-18 NOTE — Progress Notes (Unsigned)
 " Cardiology Office Note:    Date:  03/19/2024   ID:  Larry Huff, DOB 05/13/1970, MRN [id number]  PCP:  Billy Philippe SAUNDERS, NP  Cardiologist:  Lonni LITTIE Nanas, MD  Electrophysiologist:  Eulas FORBES Furbish, MD   Referring MD: Billy Philippe SAUNDERS, NP   Chief Complaint  Patient presents with   Cardiomyopathy    History of Present Illness:    Larry Huff is a 53 y.o. male with a hx of apical HCM, asthma, DVT who presents for follow-up.  He was referred by Philippe Billy, NP for evaluation of abnormal echocardiogram, initially seen 11/08/2023.  Noted to have abnormal EKG and echocardiogram was ordered.  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  Zio patch x 14 days showed 89 episodes of NSVT, longest lasting 13 seconds.  He was referred to EP and underwent subcutaneous ICD implantation with Dr. Furbish on 02/14/2024.  Since last clinic visit, he reports he is doing okay.  He denies any chest pain, dyspnea, lower extremity edema.  Reports some lightheadedness but denies any syncope.  Reports occasional palpitations.  Activity has been limited recently due to back pain.   Past Medical History:  Diagnosis Date   Allergy    Arrhythmia    Asthma    DVT (deep venous thrombosis) (HCC) 2015   RIGHT lower leg    Past Surgical History:  Procedure Laterality Date   ACHILLES TENDON REPAIR Right 2015   SUBQ ICD IMPLANT N/A 02/14/2024   Procedure: SUBQ ICD IMPLANT;  Surgeon: Furbish Eulas FORBES, MD;  Location: [facility];  Service: Cardiovascular;  Laterality: N/A;   TOOTH EXTRACTION  2023    Current Medications: Current Meds  Medication Sig   acetaminophen  (TYLENOL ) 325 MG tablet Take 650 mg  by mouth every 6 (six) hours as needed.   aspirin  EC 81 MG tablet Take 1 tablet (81 mg total) by mouth daily.     Allergies:   Egg protein-containing drug products, Erythromycin, Milk protein, and Milk-related compounds   Social History   Socioeconomic History   Marital status: Married    Spouse name: Not on file   Number of children: 4   Years of education: Not on file   Highest education level: Some college, no degree  Occupational History   Occupation: Mental Health  Tobacco Use   Smoking status: Never   Smokeless tobacco: Never  Vaping Use   Vaping status: Never Used  Substance and Sexual Activity   Alcohol use: Not Currently   Drug use: No   Sexual activity: Yes    Partners: Female  Other Topics Concern   Not on file  Social History Narrative   Wife - [name]   4 Children - 3 daughters and a son   Exercise - 2-3 x week - martial arts   Enjoys reading, administrator, sports, martial arts.   Social Drivers of Health   Tobacco Use: Low Risk (03/19/2024)   Patient History    Smoking Tobacco Use: Never    Smokeless Tobacco Use: Never    Passive Exposure: Not on file  Financial Resource Strain: Low Risk (08/24/2023)   Overall Financial Resource Strain (CARDIA)    Difficulty of Paying Living Expenses: Not hard at all  Food  Insecurity: No Food Insecurity (08/24/2023)   Hunger Vital Sign    Worried About Running Out of Food in the Last Year: Never true    Ran Out of Food in the Last Year: Never true  Transportation Needs: No Transportation Needs (08/24/2023)   PRAPARE - Administrator, Civil Service (Medical): No    Lack of Transportation (Non-Medical): No  Physical Activity: Sufficiently Active (08/24/2023)   Exercise Vital Sign    Days of Exercise per Week: 5 days    Minutes of Exercise per Session: 40 min  Stress: No Stress Concern Present (08/24/2023)   [name] of Occupational Health - Occupational Stress Questionnaire    Feeling of Stress : Not at all   Social Connections: Socially Integrated (08/24/2023)   Social Connection and Isolation Panel    Frequency of Communication with Friends and Family: More than three times a week    Frequency of Social Gatherings with Friends and Family: More than three times a week    Attends Religious Services: More than 4 times per year    Active Member of Clubs or Organizations: Yes    Attends Banker Meetings: More than 4 times per year    Marital Status: Married  Depression (PHQ2-9): Low Risk (09/27/2023)   Depression (PHQ2-9)    PHQ-2 Score: 0  Alcohol Screen: Low Risk (08/24/2023)   Alcohol Screen    Last Alcohol Screening Score (AUDIT): 3  Housing: Unknown (01/11/2024)   Received from [facility]   Epic    Unable to Pay for Housing in the Last Year: Not on file    Number of Times Moved in the Last Year: Not on file    At any time in the past 12 months, were you homeless or living in a shelter (including now)?: No  Utilities: Not At Risk (09/27/2023)   Epic    Threatened with loss of utilities: No  Health Literacy: Adequate Health Literacy (09/27/2023)   B1300 Health Literacy    Frequency of need for help with medical instructions: Never     Family History: The patient's family history includes Asthma in his mother; Heart murmur in his mother.  ROS:   Please see the history of present illness.     All other systems reviewed and are negative.  EKGs/Labs/Other Studies Reviewed:    The following studies were reviewed today:   EKG:   11/08/2023: Normal sinus rhythm, rate 71, LVH with repolarization abnormalities 03/19/2024: Normal sinus rhythm, rate 81, LVH with repolarization abnormalities  Recent Labs: 09/27/2023: ALT 22; TSH 1.86 02/08/2024: BUN 12; Creatinine, Ser 1.10; Hemoglobin 15.6; Platelets 304; Potassium 4.8; Sodium 138  Recent Lipid Panel    Component Value Date/Time   CHOL 205 (H) 09/27/2023 1213   TRIG 81.0 09/27/2023 1213   HDL 46.30 09/27/2023  1213   CHOLHDL 4 09/27/2023 1213   VLDL 16.2 09/27/2023 1213   LDLCALC 143 (H) 09/27/2023 1213    Physical Exam:    VS:  BP 132/86   Pulse 87   Ht 6' 3 (1.905 m)   Wt 242 lb (109.8 kg)   SpO2 97%   BMI 30.25 kg/m     Wt Readings from Last 3 Encounters:  03/19/24 242 lb (109.8 kg)  02/14/24 235 lb (106.6 kg)  01/20/24 235 lb (106.6 kg)     GEN:  Well nourished, well developed in no acute distress HEENT: Normal NECK: No JVD; No carotid bruits LYMPHATICS: No lymphadenopathy  CARDIAC: RRR, no murmurs, rubs, gallops RESPIRATORY:  Clear to auscultation without rales, wheezing or rhonchi  ABDOMEN: Soft, non-tender, non-distended MUSCULOSKELETAL:  No edema; No deformity  SKIN: Warm and dry NEUROLOGIC:  Alert and oriented x 3 PSYCHIATRIC:  Normal affect   ASSESSMENT:    1. Pre-op evaluation   2. Hypertrophic cardiomyopathy (HCC)   3. NSVT (nonsustained ventricular tachycardia) (HCC)     PLAN:    Preop evaluation: Planning colonoscopy.  Low risk procedure.  Excellent functional capacity.  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  Zio patch x 14 days showed 89 episodes of NSVT, longest lasting 13 seconds. He was referred to EP and underwent subcutaneous ICD implantation with Dr. Nancey on 02/14/2024. - Recommend first-degree relatives to be screened, he has 4 children ages 19-28 - Started on Toprol-XL 25 mg daily but reports has not started to take.  Recommend starting - Reports he participates in jujitsu, recommend avoiding contact sports given his HCM and subcutaneous ICD  NSVT: Zio patch x 14 days showed 89 episodes of NSVT,  longest lasting 13 seconds.  Likely due to HCM as above.  Status post ICD 01/2024 - Start Toprol-XL 25 mg daily as above - Follows with EP  DVT: After Achilles tendon tear.  No longer on anticoagulation  RTC in 6 months   Medication Adjustments/Labs and Tests Ordered: Current medicines are reviewed at length with the patient today.  Concerns regarding medicines are outlined above.  Orders Placed This Encounter  Procedures   EKG 12-Lead   Meds ordered this encounter  Medications   metoprolol succinate (TOPROL-XL) 25 MG 24 hr tablet    Sig: Take 1 tablet (25 mg total) by mouth daily.    Dispense:  90 tablet    Refill:  3    Patient Instructions  Medication Instructions:  START METOPROLOL SUC 25 MG DAILY   *If you need a refill on your cardiac medications before your next appointment, please call your pharmacy*  Lab Work: NONE If you have labs (blood work) drawn today and your tests are completely normal, you will receive your results only by: MyChart Message (if you have MyChart) OR A paper copy in the mail If you have any lab test that is abnormal or we need to change your treatment, we will call you to review the results.  Testing/Procedures: NONE  Follow-Up: At [facility], you and your health needs are our priority.  As part of our continuing mission to provide you with exceptional heart care, our providers are all part of one team.  This team includes your primary Cardiologist (physician) and Advanced Practice Providers or APPs (Physician Assistants and Nurse Practitioners) who all work together to provide you with the care you need, when you need it.  Your next appointment:   6 month(s)  Provider:   Lonni Nanas, MD    We recommend signing up for the patient portal called MyChart.  Sign up information is provided on this After Visit Summary.  MyChart is used to connect with patients for Virtual Visits (Telemedicine).  Patients are able to view  lab/test results, encounter notes, upcoming appointments, etc.  Non-urgent messages can be sent to your  provider as well.   To learn more about what you can do with MyChart, go to [email or web address].             Signed, Lonni LITTIE Nanas, MD  03/19/2024 12:19 PM    [facility] "

## 2024-03-19 ENCOUNTER — Ambulatory Visit (INDEPENDENT_AMBULATORY_CARE_PROVIDER_SITE_OTHER): Admitting: Cardiology

## 2024-03-19 ENCOUNTER — Encounter (HOSPITAL_BASED_OUTPATIENT_CLINIC_OR_DEPARTMENT_OTHER): Payer: Self-pay | Admitting: Cardiology

## 2024-03-19 VITALS — BP 132/86 | HR 87 | Ht 75.0 in | Wt 242.0 lb

## 2024-03-19 DIAGNOSIS — I422 Other hypertrophic cardiomyopathy: Secondary | ICD-10-CM | POA: Diagnosis not present

## 2024-03-19 DIAGNOSIS — Z01810 Encounter for preprocedural cardiovascular examination: Secondary | ICD-10-CM

## 2024-03-19 DIAGNOSIS — Z01818 Encounter for other preprocedural examination: Secondary | ICD-10-CM

## 2024-03-19 DIAGNOSIS — I4729 Other ventricular tachycardia: Secondary | ICD-10-CM | POA: Diagnosis not present

## 2024-03-19 MED ORDER — METOPROLOL SUCCINATE ER 25 MG PO TB24: 25.0000 mg | ORAL_TABLET | Freq: Every day | ORAL | 3 refills | Status: AC

## 2024-03-19 NOTE — Patient Instructions (Signed)
 Medication Instructions:  START METOPROLOL SUC 25 MG DAILY   *If you need a refill on your cardiac medications before your next appointment, please call your pharmacy*  Lab Work: NONE If you have labs (blood work) drawn today and your tests are completely normal, you will receive your results only by: MyChart Message (if you have MyChart) OR A paper copy in the mail If you have any lab test that is abnormal or we need to change your treatment, we will call you to review the results.  Testing/Procedures: NONE  Follow-Up: At [facility], you and your health needs are our priority.  As part of our continuing mission to provide you with exceptional heart care, our providers are all part of one team.  This team includes your primary Cardiologist (physician) and Advanced Practice Providers or APPs (Physician Assistants and Nurse Practitioners) who all work together to provide you with the care you need, when you need it.  Your next appointment:   6 month(s)  Provider:   Lonni Nanas, MD    We recommend signing up for the patient portal called MyChart.  Sign up information is provided on this After Visit Summary.  MyChart is used to connect with patients for Virtual Visits (Telemedicine).  Patients are able to view lab/test results, encounter notes, upcoming appointments, etc.  Non-urgent messages can be sent to your provider as well.   To learn more about what you can do with MyChart, go to [email or web address].

## 2024-03-21 NOTE — Telephone Encounter (Signed)
 Dr Stacia-  Please see cardiology note dated 03/19/24. Is it okay at this time to go ahead and reschedule patient for [facility] procedure at this time?

## 2024-03-23 MED ORDER — NA SULFATE-K SULFATE-MG SULF 17.5-3.13-1.6 GM/177ML PO SOLN: ORAL | 0 refills | Status: AC

## 2024-03-23 NOTE — Telephone Encounter (Signed)
 I have spoken to patient to advise that we have been given clearance by cardiology to proceed with colonoscopy procedure in [facility] at this time. Patient has scheduled his procedure for 04/17/24 at 2 pm. Patient has been advised of time/date/location for upcoming procedure and has been given generalized verbal prep instructions. Discussed that a care partner 18 years or older should bring him, stay for the procedure and drive home due to sedation. Written instructions have been made available to the patient for review via mychart.

## 2024-03-23 NOTE — Addendum Note (Signed)
 Addended by: CLAUDENE NAOMIE SAILOR on: 03/23/2024 11:41 AM   Modules accepted: Orders

## 2024-03-27 ENCOUNTER — Ambulatory Visit: Attending: Cardiovascular Disease

## 2024-03-27 DIAGNOSIS — I422 Other hypertrophic cardiomyopathy: Secondary | ICD-10-CM

## 2024-03-28 ENCOUNTER — Telehealth (HOSPITAL_BASED_OUTPATIENT_CLINIC_OR_DEPARTMENT_OTHER): Payer: Self-pay | Admitting: *Deleted

## 2024-03-28 NOTE — Telephone Encounter (Signed)
"  ° °  Pre-operative Risk Assessment    Patient Name: Larry Huff  DOB: 1970-09-14 MRN: [id number]   Date of last office visit: 03/19/24 DR. SCHUMANN Date of next office visit: NONE   Request for Surgical Clearance    Procedure:  LUMBAR DECOMPRESSION  Date of Surgery:  Clearance TBD                                Surgeon:  DR. REYES BILLING Surgeon's Group or Practice Name:  [facility] Phone number:  [phone] [facility] Fax number:  [phone]   Type of Clearance Requested:   - Medical  - Pharmacy:  Hold Aspirin      Type of Anesthesia:  General    Additional requests/questions:    [name]   03/28/2024, 2:47 PM   "

## 2024-03-29 ENCOUNTER — Encounter: Payer: Self-pay | Admitting: Cardiovascular Disease

## 2024-03-29 LAB — CUP PACEART REMOTE DEVICE CHECK
Battery Remaining Percentage: 100 %
Date Time Interrogation Session: 20260106070100
HighPow Impedance: 65 Ohm
Implantable Lead Connection Status: 753985
Implantable Lead Implant Date: 20251125
Implantable Lead Location: 753860
Implantable Lead Model: 3501
Implantable Lead Serial Number: 294635
Implantable Pulse Generator Implant Date: 20251125
Pulse Gen Serial Number: 330565

## 2024-03-29 NOTE — Telephone Encounter (Signed)
"  ° °  Patient Name: Larry Huff  DOB: [birth year] MRN: [id number]  Primary Cardiologist: Lonni LITTIE Nanas, MD  Chart reviewed as part of pre-operative protocol coverage. Given past medical history and time since last visit, based on ACC/AHA guidelines, ZANDON TALTON is at acceptable risk for the planned procedure without further cardiovascular testing.  Patient was seen by Dr. Nanas on 03/19/2024 and was doing well.  He is able to achieve greater than 4 METS.   Regarding ASA therapy, we recommend continuation of ASA throughout the perioperative period.  However, if the surgeon feels that cessation of ASA is required in the perioperative period, it may be stopped 5-7 days prior to surgery with a plan to resume it as soon as felt to be feasible from a surgical standpoint in the post-operative period.  I will route this recommendation to the requesting party via Epic fax function and remove from pre-op pool.  Please call with questions.  Damien JAYSON Braver, NP 03/29/2024, 11:06 AM  "

## 2024-03-29 NOTE — Progress Notes (Signed)
 PERIOPERATIVE PRESCRIPTION FOR IMPLANTED CARDIAC DEVICE PROGRAMMING  Patient Information: Name:  Larry Huff  DOB:  05-06-1970  MRN:  [id number]  Procedure:  LUMBAR DECOMPRESSION   Date of Surgery:  Clearance TBD                                  Surgeon:  DR. REYES BILLING Surgeon's Group or Practice Name:  [facility] Phone number:  [phone] [facility] Fax number:  [phone]   Type of Clearance Requested:   - Medical  - Pharmacy:  Hold Aspirin      Type of Anesthesia:  General  Device Information:  Clinic EP Physician:  Dr. Eulas Furbish  Device Type:  Defibrillator-subcutaneous Manufacturer and Phone #:  [employer]: [phone] Pacemaker Dependent?:  No. Date of Last Device Check:  03/27/2024 Normal Device Function?:  Yes.    Electrophysiologist's Recommendations:  Have magnet available. Provide continuous ECG monitoring when magnet is used or reprogramming is to be performed.  Procedure may interfere with device function.  Magnet should be placed over device during procedure.  Per Device Clinic Standing Orders, Delon DELENA Sharps, RN  1:42 PM 03/29/2024

## 2024-03-31 ENCOUNTER — Ambulatory Visit: Payer: Self-pay | Admitting: Cardiovascular Disease

## 2024-04-02 NOTE — Progress Notes (Signed)
 Remote ICD Transmission

## 2024-04-16 ENCOUNTER — Encounter: Payer: Self-pay | Admitting: Gastroenterology

## 2024-04-17 ENCOUNTER — Encounter: Admitting: Gastroenterology

## 2024-04-24 ENCOUNTER — Encounter: Admitting: Gastroenterology

## 2024-04-24 ENCOUNTER — Telehealth: Payer: Self-pay | Admitting: Gastroenterology

## 2024-04-24 NOTE — Telephone Encounter (Signed)
 Spoke with patient.  He said he tried to call to cancel the appointment on Friday and yesterday as well.  He said he hurt his back and is going to have to have surgery.  He said he would call at a later date to reschedule.  Patient has [employer].

## 2024-04-25 ENCOUNTER — Ambulatory Visit: Admitting: Family Medicine

## 2024-05-01 ENCOUNTER — Ambulatory Visit: Admitting: Family Medicine

## 2024-05-16 ENCOUNTER — Ambulatory Visit: Admitting: Student

## 2024-06-26 ENCOUNTER — Ambulatory Visit

## 2024-09-25 ENCOUNTER — Ambulatory Visit

## 2024-12-25 ENCOUNTER — Ambulatory Visit
